# Patient Record
Sex: Male | Born: 1955 | Race: White | Hispanic: No | Marital: Married | Smoking: Current every day smoker
Health system: Southern US, Community
[De-identification: ages and names within clinical notes are randomized; demographics above are authoritative.]

## PROBLEM LIST (undated history)

## (undated) ENCOUNTER — Emergency Department: Disposition: A | Discharge status: Home / Self Care | Payer: 59

## (undated) HISTORY — PX: KNEE SURGERY: SHX244

---

## 2008-07-27 ENCOUNTER — Ambulatory Visit: Payer: Self-pay | Admitting: Unknown Physician Specialty

## 2010-12-11 ENCOUNTER — Ambulatory Visit: Payer: Self-pay

## 2010-12-25 ENCOUNTER — Ambulatory Visit: Payer: Self-pay | Admitting: Unknown Physician Specialty

## 2010-12-25 DIAGNOSIS — Z01810 Encounter for preprocedural cardiovascular examination: Secondary | ICD-10-CM

## 2010-12-27 ENCOUNTER — Ambulatory Visit: Payer: Self-pay | Admitting: Unknown Physician Specialty

## 2012-05-21 ENCOUNTER — Ambulatory Visit: Payer: Self-pay | Admitting: Gastroenterology

## 2012-05-24 LAB — PATHOLOGY REPORT

## 2014-03-13 ENCOUNTER — Emergency Department: Payer: Self-pay | Admitting: Emergency Medicine

## 2014-03-13 LAB — CBC
HCT: 41.9 % (ref 40.0–52.0)
HGB: 14.5 g/dL (ref 13.0–18.0)
MCH: 35.4 pg — AB (ref 26.0–34.0)
MCHC: 34.5 g/dL (ref 32.0–36.0)
MCV: 102 fL — AB (ref 80–100)
PLATELETS: 164 10*3/uL (ref 150–440)
RBC: 4.09 10*6/uL — ABNORMAL LOW (ref 4.40–5.90)
RDW: 14 % (ref 11.5–14.5)
WBC: 9.2 10*3/uL (ref 3.8–10.6)

## 2014-03-13 LAB — COMPREHENSIVE METABOLIC PANEL
ALT: 59 U/L (ref 12–78)
ANION GAP: 6 — AB (ref 7–16)
AST: 49 U/L — AB (ref 15–37)
Albumin: 3.7 g/dL (ref 3.4–5.0)
Alkaline Phosphatase: 75 U/L
BILIRUBIN TOTAL: 0.5 mg/dL (ref 0.2–1.0)
BUN: 15 mg/dL (ref 7–18)
CO2: 28 mmol/L (ref 21–32)
CREATININE: 0.9 mg/dL (ref 0.60–1.30)
Calcium, Total: 8.5 mg/dL (ref 8.5–10.1)
Chloride: 102 mmol/L (ref 98–107)
Glucose: 106 mg/dL — ABNORMAL HIGH (ref 65–99)
Osmolality: 273 (ref 275–301)
Potassium: 3.9 mmol/L (ref 3.5–5.1)
SODIUM: 136 mmol/L (ref 136–145)
TOTAL PROTEIN: 7.5 g/dL (ref 6.4–8.2)

## 2014-03-13 LAB — CK TOTAL AND CKMB (NOT AT ARMC)
CK, Total: 90 U/L
CK-MB: 1.3 ng/mL (ref 0.5–3.6)

## 2014-03-13 LAB — PRO B NATRIURETIC PEPTIDE: B-TYPE NATIURETIC PEPTID: 159 pg/mL — AB (ref 0–125)

## 2014-03-13 LAB — TROPONIN I: Troponin-I: <0.02 ng/mL

## 2014-12-29 ENCOUNTER — Other Ambulatory Visit: Payer: Self-pay | Admitting: Unknown Physician Specialty

## 2014-12-29 DIAGNOSIS — R112 Nausea with vomiting, unspecified: Secondary | ICD-10-CM

## 2014-12-29 DIAGNOSIS — R1013 Epigastric pain: Secondary | ICD-10-CM

## 2015-01-29 ENCOUNTER — Ambulatory Visit: Payer: 59

## 2015-01-29 ENCOUNTER — Encounter: Admission: RE | Admit: 2015-01-29 | Payer: 59 | Source: Ambulatory Visit

## 2015-10-05 ENCOUNTER — Encounter: Payer: Self-pay | Admitting: *Deleted

## 2015-10-08 ENCOUNTER — Encounter: Admission: RE | Payer: Self-pay | Source: Ambulatory Visit

## 2015-10-08 ENCOUNTER — Encounter: Payer: Self-pay | Admitting: Anesthesiology

## 2015-10-08 ENCOUNTER — Ambulatory Visit: Admission: RE | Admit: 2015-10-08 | Payer: 59 | Source: Ambulatory Visit | Admitting: Unknown Physician Specialty

## 2015-10-08 SURGERY — ESOPHAGOGASTRODUODENOSCOPY (EGD) WITH PROPOFOL
Anesthesia: General

## 2015-10-09 ENCOUNTER — Other Ambulatory Visit
Admission: RE | Admit: 2015-10-09 | Discharge: 2015-10-09 | Disposition: A | Discharge status: Home / Self Care | Payer: Worker's Compensation | Attending: Family Medicine | Admitting: Family Medicine

## 2015-10-10 NOTE — ED Notes (Addendum)
Performed blood alcohol and breathalyzer ED Tech Renesha Lizama assisted by [employer]. Blood alcohol taking to lab for [facility] pickup. Breathalyzer first reading .110 waited 15 minutes 2nd breathalyzer reading .099.

## 2016-11-08 ENCOUNTER — Emergency Department: Payer: Commercial Managed Care - PPO

## 2016-11-08 ENCOUNTER — Encounter: Payer: Self-pay | Admitting: Emergency Medicine

## 2016-11-08 ENCOUNTER — Emergency Department
Admission: EM | Admit: 2016-11-08 | Discharge: 2016-11-08 | Disposition: A | Discharge status: Home / Self Care | Payer: Commercial Managed Care - PPO | Attending: Emergency Medicine | Admitting: Emergency Medicine

## 2016-11-08 DIAGNOSIS — S43014A Anterior dislocation of right humerus, initial encounter: Secondary | ICD-10-CM | POA: Diagnosis not present

## 2016-11-08 DIAGNOSIS — Y999 Unspecified external cause status: Secondary | ICD-10-CM | POA: Diagnosis not present

## 2016-11-08 DIAGNOSIS — Y939 Activity, unspecified: Secondary | ICD-10-CM | POA: Insufficient documentation

## 2016-11-08 DIAGNOSIS — F17210 Nicotine dependence, cigarettes, uncomplicated: Secondary | ICD-10-CM | POA: Diagnosis not present

## 2016-11-08 DIAGNOSIS — W010XXA Fall on same level from slipping, tripping and stumbling without subsequent striking against object, initial encounter: Secondary | ICD-10-CM | POA: Insufficient documentation

## 2016-11-08 DIAGNOSIS — S43004A Unspecified dislocation of right shoulder joint, initial encounter: Secondary | ICD-10-CM

## 2016-11-08 DIAGNOSIS — Z79899 Other long term (current) drug therapy: Secondary | ICD-10-CM | POA: Diagnosis not present

## 2016-11-08 DIAGNOSIS — Y929 Unspecified place or not applicable: Secondary | ICD-10-CM | POA: Insufficient documentation

## 2016-11-08 DIAGNOSIS — S4991XA Unspecified injury of right shoulder and upper arm, initial encounter: Secondary | ICD-10-CM | POA: Diagnosis present

## 2016-11-08 MED ORDER — PROPOFOL 10 MG/ML IV BOLUS
INTRAVENOUS | Status: AC | PRN
  Administered 2016-11-08: 50 mg via INTRAVENOUS
  Administered 2016-11-08: 100 mg via INTRAVENOUS
  Administered 2016-11-08: 50 mg via INTRAVENOUS

## 2016-11-08 MED ORDER — PROPOFOL 10 MG/ML IV BOLUS
100.0000 mg | Freq: Once | INTRAVENOUS | Status: AC
  Administered 2016-11-08: 100 mg via INTRAVENOUS

## 2016-11-08 MED ORDER — LIDOCAINE HCL (PF) 1 % IJ SOLN
30.0000 mL | Freq: Once | INTRAMUSCULAR | Status: AC
  Administered 2016-11-08: 30 mL
  Filled 2016-11-08: qty 30

## 2016-11-08 MED ORDER — SODIUM CHLORIDE 0.9 % IV BOLUS (SEPSIS)
1000.0000 mL | Freq: Once | INTRAVENOUS | Status: AC
  Administered 2016-11-08: 1000 mL via INTRAVENOUS

## 2016-11-08 MED ORDER — PROPOFOL 1000 MG/100ML IV EMUL
INTRAVENOUS | Status: AC
  Administered 2016-11-08: 100 mg via INTRAVENOUS
  Filled 2016-11-08: qty 100

## 2016-11-08 NOTE — Discharge Instructions (Addendum)
Please keep your shoulder and the immobilizer at all times until you're cleared by an orthopedic surgeon. Return to the emergency department for any new or worsening symptoms such as worsening pain, numbness, weakness, or for any other concerns.

## 2016-11-08 NOTE — Sedation Documentation (Signed)
Patient is resting comfortably. 

## 2016-11-08 NOTE — ED Provider Notes (Signed)
[facility] Emergency Department Provider Note  ____________________________________________   None    (approximate)  I have reviewed the triage vital signs and the nursing notes.   HISTORY  Chief Complaint Shoulder Injury    HPI Micheal Decker is a 61 y.o. male who comes to the emergency department with reported right shoulder dislocation sustained 24 hours ago. He is right-hand dominant and is retired. He was at home when he slipped and fell sustaining the injury. He initially thought that his shoulder was sprained and went to sleep but was unable to sleep last night much secondary to discomfort. Today he walked into the [facility] where he had an x-ray done which reportedly shows a dislocation was advised to come to the emergency department. He has moderate to severe aching stabbing right shoulder pain worse with movement and improved with rest. It is nonradiating.   History reviewed. No pertinent past medical history.  There are no active problems to display for this patient.   Past Surgical History:  Procedure Laterality Date  . KNEE SURGERY      Prior to Admission medications   Medication Sig Start Date End Date Taking? Authorizing Provider  azelastine (ASTELIN) 0.1 % nasal spray Place into both nostrils 2 (two) times daily. Use in each nostril as directed    Historical Provider, MD  fluticasone (FLONASE) 50 MCG/ACT nasal spray Place 1 spray into both nostrils daily.    Historical Provider, MD  LORazepam (ATIVAN) 1 MG tablet Take 1 mg by mouth every 8 (eight) hours.    Historical Provider, MD  pantoprazole (PROTONIX) 40 MG tablet Take 40 mg by mouth daily.    Historical Provider, MD  pseudoephedrine (SUDAFED) 30 MG tablet Take 30 mg by mouth every 4 (four) hours as needed for congestion.    Historical Provider, MD  tadalafil (CIALIS) 10 MG tablet Take 10 mg by mouth daily as needed for erectile dysfunction.    Historical Provider, MD     Allergies Patient has no known allergies.  History reviewed. No pertinent family history.  Social History Social History  Substance Use Topics  . Smoking status: Current Every Day Smoker    Packs/day: 1.50    Types: Cigarettes  . Smokeless tobacco: Never Used  . Alcohol use Yes    Review of Systems Constitutional: No fever/chills Eyes: No visual changes. ENT: No sore throat. Cardiovascular: Denies chest pain. Respiratory: Denies shortness of breath. Gastrointestinal: No abdominal pain.  No nausea, no vomiting.  No diarrhea.  No constipation. Genitourinary: Negative for dysuria. Musculoskeletal: Negative for back pain.Positive for shoulder pain Skin: Negative for rash. Neurological: Negative for headaches, focal weakness or numbness.  10-point ROS otherwise negative.  ____________________________________________   PHYSICAL EXAM:  VITAL SIGNS: ED Triage Vitals [11/08/16 1345]  Enc Vitals Group     BP (!) 161/72     Pulse Rate 100     Resp 18     Temp 98.4 F (36.9 C)     Temp Source Oral     SpO2 96 %     Weight 215 lb (97.5 kg)     Height '5\' 10"'$  (1.778 m)     Head Circumference      Peak Flow      Pain Score 5     Pain Loc      Pain Edu?      Excl. in [place]?     Constitutional: Alert and oriented. Appears uncomfortable holding his right shoulder  internally rotated and flexed Eyes: Conjunctivae are normal. PERRL. EOMI. Head: Atraumatic. Nose: No congestion/rhinnorhea. Mouth/Throat: Mucous membranes are moist.  Oropharynx non-erythematous. Neck: No stridor.   Cardiovascular: Normal rate, regular rhythm. Grossly normal heart sounds.  Good peripheral circulation. Respiratory: Normal respiratory effort.  No retractions. Lungs CTAB. Gastrointestinal: Soft and nontender. No distention. No abdominal bruits. No CVA tenderness. Musculoskeletal: Right shoulder internally rotated and flexed at the elbow unable to externally rotate and unable to touch left shoulder  with right hand palpable defect on the right shoulder his sensation intact to light touch of her deltoid first dorsal webspace distal index finger distal small finger he can fire his deltoid cross 2 on 3 extend his wrist and flexion oppose his thumb Neurologic:  Normal speech and language. No gross focal neurologic deficits are appreciated. No gait instability. Skin:  Skin is warm, dry and intact. No rash noted. Psychiatric: Mood and affect are normal. Speech and behavior are normal.  ____________________________________________   LABS (all labs ordered are listed, but only abnormal results are displayed)  Labs Reviewed - No data to display ____________________________________________  EKG   ____________________________________________  RADIOLOGY  First x-ray showed anterior shoulder dislocation second x-ray showed confirmed reduction ____________________________________________   PROCEDURES  Procedure(s) performed: yes  Reduction of dislocation Date/Time: 2:33 PM Performed by: Darel Hong Authorized by: Darel Hong Consent: Verbal consent obtained. Risks and benefits: risks, benefits and alternatives were discussed Consent given by: patient Required items: required blood products, implants, devices, and special equipment available Time out: Immediately prior to procedure a "time out" was called to verify the correct patient, procedure, equipment, support staff and site/side marked as required.  Patient sedated: with a total of '200mg'$  of IV propofol  Vitals: Vital signs were monitored during sedation. Patient tolerance: Patient tolerated the procedure well with no immediate complications. Joint: right shoulder Reduction technique: external rotation and supination   Procedural sedation Performed by: Darel Hong Consent: Verbal consent obtained. Risks and benefits: risks, benefits and alternatives were discussed Required items: required blood products,  implants, devices, and special equipment available Patient identity confirmed: arm band and provided demographic data Time out: Immediately prior to procedure a "time out" was called to verify the correct patient, procedure, equipment, support staff and site/side marked as required.  Sedation type: moderate (conscious) sedation NPO time confirmed and considedered  Sedatives: PROPOFOL  Physician Time at Bedside: 20 min  Vitals: Vital signs were monitored during sedation. Cardiac Monitor, pulse oximeter Patient tolerance: Patient tolerated the procedure well with no immediate complications. Comments: Pt with uneventful recovered. Returned to pre-procedural sedation baseline     Procedures  Critical Care performed: no  ____________________________________________   INITIAL IMPRESSION / ASSESSMENT AND PLAN / ED COURSE  Pertinent labs & imaging results that were available during my care of the patient were reviewed by me and considered in my medical decision making (see chart for details).  On arrival the patient came with with a reported shoulder dislocation. His clinical exam was consistent with anterior dislocation but unfortunately I was unable to see his previous films. He arrived neurovascularly intact in roughly 24 hours after the incident. New x-ray confirmed anterior shoulder dislocation. I initially sterilely injected a total of 17 cc of 1% lidocaine without epinephrine into his joint using direct ultrasound visualization with an 18-gauge spinal needle after getting a flash of hematoma but was unable to reduce his shoulder thereafter. He had a significant amount of muscle spasm. I then consented the patient for procedural  sedation and gave a total of 200 mg of propofol and was able to successfully reduce his shoulder dislocation. He is placed in a shoulder immobilizer and was neurovascularly intact thereafter. He awoke from the propofol well and his wife came to the emergency  department to pick him up. He is discharged home with orthopedic follow-up and is discharged in stable condition with an x-ray confirming successful reduction of the shoulder.      ____________________________________________   FINAL CLINICAL IMPRESSION(S) / ED DIAGNOSES  Final diagnoses:  Dislocation of right shoulder joint, initial encounter      NEW MEDICATIONS STARTED DURING THIS VISIT:  Discharge Medication List as of 11/08/2016  4:57 PM       Note:  This document was prepared using Dragon voice recognition software and may include unintentional dictation errors.     Darel Hong, MD 11/09/16 [phone]

## 2016-11-08 NOTE — ED Triage Notes (Signed)
Pt tripped and fell into wall yesterday. Went to [facility] and sent here for right shoulder dislocation per xray

## 2016-11-08 NOTE — ED Notes (Signed)
MD at bedside. 

## 2016-11-08 NOTE — Sedation Documentation (Signed)
Patient denies pain and is resting comfortably.  

## 2018-03-04 ENCOUNTER — Other Ambulatory Visit: Payer: Self-pay | Admitting: Specialist

## 2018-03-04 DIAGNOSIS — R0602 Shortness of breath: Secondary | ICD-10-CM

## 2018-03-04 DIAGNOSIS — R918 Other nonspecific abnormal finding of lung field: Secondary | ICD-10-CM

## 2018-03-08 ENCOUNTER — Ambulatory Visit
Admission: RE | Admit: 2018-03-08 | Discharge: 2018-03-08 | Disposition: A | Discharge status: Home / Self Care | Payer: Commercial Managed Care - PPO | Source: Ambulatory Visit | Attending: Specialist | Admitting: Specialist

## 2018-03-08 DIAGNOSIS — R918 Other nonspecific abnormal finding of lung field: Secondary | ICD-10-CM | POA: Insufficient documentation

## 2018-03-08 DIAGNOSIS — E278 Other specified disorders of adrenal gland: Secondary | ICD-10-CM | POA: Insufficient documentation

## 2018-03-08 DIAGNOSIS — J432 Centrilobular emphysema: Secondary | ICD-10-CM | POA: Diagnosis not present

## 2018-03-08 DIAGNOSIS — I2510 Atherosclerotic heart disease of native coronary artery without angina pectoris: Secondary | ICD-10-CM | POA: Insufficient documentation

## 2018-03-08 DIAGNOSIS — R0602 Shortness of breath: Secondary | ICD-10-CM | POA: Diagnosis present

## 2018-03-08 DIAGNOSIS — C3490 Malignant neoplasm of unspecified part of unspecified bronchus or lung: Secondary | ICD-10-CM | POA: Insufficient documentation

## 2018-03-08 DIAGNOSIS — I700 Atherosclerosis of aorta: Secondary | ICD-10-CM | POA: Diagnosis not present

## 2018-03-11 ENCOUNTER — Other Ambulatory Visit: Payer: Self-pay | Admitting: Specialist

## 2018-03-11 DIAGNOSIS — R918 Other nonspecific abnormal finding of lung field: Secondary | ICD-10-CM

## 2018-03-14 DIAGNOSIS — R918 Other nonspecific abnormal finding of lung field: Secondary | ICD-10-CM | POA: Insufficient documentation

## 2018-03-14 NOTE — Progress Notes (Signed)
[place]  [phone] [phone]  ID: Micheal Decker OB: 1955-12-26  MR#: [id number]  QQI#:[id number]  Patient Care Team: [place] Pink, MD as PCP - General (Family Medicine) Telford Nab, RN as Registered Nurse  CHIEF COMPLAINT: Right lower lobe lung mass.  INTERVAL HISTORY: Patient is a 62 year old male with a chronic cough who had acute onset chest pain approximately 2 weeks ago.  His pain has since resolved, chest x-ray and subsequent CT scan revealed a right lower lobe lung mass highly suspicious for malignancy.  He has no neurologic complaints.  He has a fair appetite and admits some weight loss.  He denies any recent fevers.  He has no chest pain, shortness of breath, or hemoptysis.  He denies any nausea, vomiting, constipation, or diarrhea.  He has no urinary complaints.  Patient otherwise feels well and offers no further specific complaints.  REVIEW OF SYSTEMS:   Review of Systems  Constitutional: Positive for weight loss. Negative for fever and malaise/fatigue.  Respiratory: Positive for cough. Negative for hemoptysis and shortness of breath.   Cardiovascular: Negative.  Negative for chest pain and leg swelling.  Gastrointestinal: Negative.  Negative for abdominal pain.  Genitourinary: Negative.  Negative for dysuria.  Musculoskeletal: Negative.  Negative for back pain.  Skin: Negative.  Negative for rash.  Neurological: Negative.  Negative for sensory change, focal weakness and weakness.  Psychiatric/Behavioral: Negative.  The patient is not nervous/anxious.     As per HPI. Otherwise, a complete review of systems is negative.  PAST MEDICAL HISTORY: History reviewed. No pertinent past medical history.  PAST SURGICAL HISTORY: Past Surgical History:  Procedure Laterality Date  . KNEE SURGERY      FAMILY HISTORY: Family History  Problem Relation Age of Onset  . Stroke Mother   . Heart attack Father   . Diabetes Sister     . Heart attack Sister   . Cancer Sister     ADVANCED DIRECTIVES (Y/N):  N  HEALTH MAINTENANCE: Social History   Tobacco Use  . Smoking status: Current Every Day Smoker    Packs/day: 1.50    Types: Cigarettes  . Smokeless tobacco: Never Used  Substance Use Topics  . Alcohol use: Not Currently    Comment: quit drinking 2018  . Drug use: Never     Colonoscopy:  PAP:  Bone density:  Lipid panel:  No Known Allergies  Current Outpatient Medications  Medication Sig Dispense Refill  . benzonatate (TESSALON) 100 MG capsule Take 1 capsule by mouth daily.  0  . cetirizine (ZYRTEC) 10 MG tablet Take 1 tablet by mouth daily.    . meloxicam (MOBIC) 15 MG tablet Take 15 mg by mouth daily.  1  . montelukast (SINGULAIR) 10 MG tablet Take 1 tablet by mouth every evening.    Marland Kitchen omeprazole (PRILOSEC) 20 MG capsule Take 1 capsule by mouth daily.    . predniSONE (DELTASONE) 5 MG tablet Take 1 tablet by mouth daily.  0  . tadalafil (CIALIS) 10 MG tablet Take 10 mg by mouth daily as needed for erectile dysfunction.    . fluticasone (FLONASE) 50 MCG/ACT nasal spray Place 1 spray into both nostrils daily.    . pseudoephedrine (SUDAFED) 30 MG tablet Take 30 mg by mouth every 4 (four) hours as needed for congestion.     No current facility-administered medications for this visit.     OBJECTIVE: Vitals:   03/15/18 1022 03/15/18 1036  BP:  106/65  Pulse:  67  Resp: 16   Temp:  98 F (36.7 C)     Body mass index is 27.5 kg/m.    ECOG FS:0 - Asymptomatic  General: Well-developed, well-nourished, no acute distress. Eyes: Pink conjunctiva, anicteric sclera. HEENT: Normocephalic, moist mucous membranes, clear oropharnyx. Lungs: Clear to auscultation bilaterally. Heart: Regular rate and rhythm. No rubs, murmurs, or gallops. Abdomen: Soft, nontender, nondistended. No organomegaly noted, normoactive bowel sounds. Musculoskeletal: No edema, cyanosis, or clubbing. Neuro: Alert, answering all  questions appropriately. Cranial nerves grossly intact. Skin: No rashes or petechiae noted. Psych: Normal affect. Lymphatics: No cervical, calvicular, axillary or inguinal LAD.   LAB RESULTS:  Lab Results  Component Value Date   NA 136 03/13/2014   K 3.9 03/13/2014   CL 102 03/13/2014   CO2 28 03/13/2014   GLUCOSE 106 (H) 03/13/2014   BUN 15 03/13/2014   CREATININE 0.90 03/13/2014   CALCIUM 8.5 03/13/2014   PROT 7.5 03/13/2014   ALBUMIN 3.7 03/13/2014   AST 49 (H) 03/13/2014   ALT 59 03/13/2014   ALKPHOS 75 03/13/2014   BILITOT 0.5 03/13/2014   GFRNONAA >60 03/13/2014   GFRAA >60 03/13/2014    Lab Results  Component Value Date   WBC 9.2 03/13/2014   HGB 14.5 03/13/2014   HCT 41.9 03/13/2014   MCV 102 (H) 03/13/2014   PLT 164 03/13/2014     STUDIES: Ct Chest Wo Contrast  Result Date: 03/08/2018 CLINICAL DATA:  Outside chest radiograph demonstrating right lower lobe lung mass. Productive cough for 3 weeks with right lower chest pain. 35 x 1.5 pack-year smoking history. EXAM: CT CHEST WITHOUT CONTRAST TECHNIQUE: Multidetector CT imaging of the chest was performed following the standard protocol without IV contrast. COMPARISON:  Report of chest radiograph 03/02/2018.  No prior CT. FINDINGS: Cardiovascular: Aortic and branch vessel atherosclerosis. Normal heart size, without pericardial effusion. Multivessel coronary artery atherosclerosis. Aortic valve calcification. Mediastinum/Nodes: Multiple small left low jugular/supraclavicular nodes. The largest measures 10 mm on image 6/2. A right lower mediastinal node measures 1.5 cm on image 83/2. No left hilar adenopathy. Right retrocrural node of 10 mm on image 133/2. Lungs/Pleura: No pleural fluid.  Mild centrilobular emphysema. Pleural-based right lower lobe lung mass with extensive contact of the right major fissure. This measures 7.7 x 7.5 cm on image 92/3. No osseous destruction. There is suggestion of chest wall involvement, as  evidenced by ill definition of fat planes between the seventh and eighth right ribs. Example image 95/2. There is direct extension of tumor versus contiguous adenopathy in the right infrahilar region. This area is difficult to differentiate from right pulmonary artery branches, including on image 87/2. Upper Abdomen: Normal imaged portions of the liver, spleen, pancreas, kidneys. Proximal gastric underdistention. Right larger than left adrenal lesions. Example at 2.5 x 2.3 cm on the left. 4.5 x 3.2 cm on the right. Prominent retroperitoneal nodes, including at 12 mm left peritoneal on image 179/2. Musculoskeletal: No acute osseous abnormality. IMPRESSION: 1. Right lower lobe primary bronchogenic carcinoma. Suspicion of small volume chest wall involvement, especially given the clinical history of chest pain. 2. Direct tumor extension and/or adenopathy in the right infrahilar region. Suspicious nodes within the mediastinum, low neck, and upper abdomen. 3. Bilateral adrenal masses, suspicious for metastatic disease. 4. Aortic atherosclerosis (ICD10-I70.0), coronary artery atherosclerosis and emphysema (ICD10-J43.9). Electronically Signed   By: Abigail Miyamoto M.D.   On: 03/08/2018 14:31    ASSESSMENT: Right lower lobe lung mass  PLAN:    1.  Right lower lobe lung mass: CT scan results from 11/08/2017 reviewed independently and report as above highly suspicious for underlying malignancy, likely lung primary.  Patient also noted to have bilateral adrenal masses that are suspicious for metastasis making this stage IV disease.  Patient has a PET scan scheduled for March 17, 2018 which she has been instructed to keep this appointment.  Will order CT-guided biopsy of his right lower lobe mass to obtain a diagnosis.  Patient reports he cannot take MRI secondary to anxiety, therefore will order CT scan of the head once a diagnosis is obtained.  Return to clinic in approximately 2 weeks after his biopsy to discuss the results  and treatment planning.  I spent a total of 60 minutes face-to-face with the patient of which greater than 50% of the visit was spent in counseling and coordination of care as detailed above.  Patient expressed understanding and was in agreement with this plan. He also understands that He can call clinic at any time with any questions, concerns, or complaints.   Cancer Staging No matching staging information was found for the patient.  Lloyd Huger, MD   03/15/2018 1:09 PM

## 2018-03-15 ENCOUNTER — Other Ambulatory Visit: Payer: Self-pay

## 2018-03-15 ENCOUNTER — Encounter (INDEPENDENT_AMBULATORY_CARE_PROVIDER_SITE_OTHER): Payer: Self-pay

## 2018-03-15 ENCOUNTER — Encounter: Payer: Self-pay | Admitting: Oncology

## 2018-03-15 ENCOUNTER — Encounter: Payer: Self-pay | Admitting: *Deleted

## 2018-03-15 ENCOUNTER — Inpatient Hospital Stay: Payer: Commercial Managed Care - PPO | Attending: Oncology | Admitting: Oncology

## 2018-03-15 DIAGNOSIS — R918 Other nonspecific abnormal finding of lung field: Secondary | ICD-10-CM

## 2018-03-15 DIAGNOSIS — R110 Nausea: Secondary | ICD-10-CM | POA: Insufficient documentation

## 2018-03-15 DIAGNOSIS — R590 Localized enlarged lymph nodes: Secondary | ICD-10-CM | POA: Diagnosis not present

## 2018-03-15 DIAGNOSIS — Z79899 Other long term (current) drug therapy: Secondary | ICD-10-CM | POA: Insufficient documentation

## 2018-03-15 DIAGNOSIS — C3431 Malignant neoplasm of lower lobe, right bronchus or lung: Secondary | ICD-10-CM | POA: Diagnosis present

## 2018-03-15 DIAGNOSIS — C7972 Secondary malignant neoplasm of left adrenal gland: Secondary | ICD-10-CM | POA: Diagnosis not present

## 2018-03-15 DIAGNOSIS — C7931 Secondary malignant neoplasm of brain: Secondary | ICD-10-CM | POA: Insufficient documentation

## 2018-03-15 DIAGNOSIS — Z5111 Encounter for antineoplastic chemotherapy: Secondary | ICD-10-CM | POA: Insufficient documentation

## 2018-03-15 DIAGNOSIS — F17210 Nicotine dependence, cigarettes, uncomplicated: Secondary | ICD-10-CM

## 2018-03-15 DIAGNOSIS — C7971 Secondary malignant neoplasm of right adrenal gland: Secondary | ICD-10-CM | POA: Insufficient documentation

## 2018-03-15 NOTE — Progress Notes (Signed)
  Oncology Nurse Navigator Documentation  Navigator Location: [facility]-Med Onc (03/15/18 1300) Referral date to RadOnc/MedOnc: 03/11/18 (03/15/18 1300) )Navigator Encounter Type: Initial MedOnc (03/15/18 1300)   Abnormal Finding Date: 03/08/18 (03/15/18 1300)                   Treatment Phase: Abnormal Scans (03/15/18 1300) Barriers/Navigation Needs: Coordination of Care (03/15/18 1300)   Interventions: Coordination of Care (03/15/18 1300)   Coordination of Care: Appts;Radiology (03/15/18 1300)        Acuity: Level 2 (03/15/18 1300)   Acuity Level 2: Assistance expediting appointments (03/15/18 1300)  met with patient during initial med-onc consultation with Dr. Grayland Ormond to review results from CT scan and discuss biopsy planning. All questions answered at the time of visit. Pt informed that will be notified by phone with appt for biopsy and follow up with Dr. Grayland Ormond to review results and discuss treatment planning. Contact info given and instructed to call with any further questions. Pt verbalized understanding.    Time Spent with Patient: 60 (03/15/18 1300)

## 2018-03-15 NOTE — Patient Instructions (Signed)
We will call you with your appointments.

## 2018-03-17 ENCOUNTER — Telehealth: Payer: Self-pay | Admitting: *Deleted

## 2018-03-17 ENCOUNTER — Ambulatory Visit
Admission: RE | Admit: 2018-03-17 | Discharge: 2018-03-17 | Disposition: A | Discharge status: Home / Self Care | Payer: Commercial Managed Care - PPO | Source: Ambulatory Visit | Attending: Specialist | Admitting: Specialist

## 2018-03-17 DIAGNOSIS — C7972 Secondary malignant neoplasm of left adrenal gland: Secondary | ICD-10-CM | POA: Insufficient documentation

## 2018-03-17 DIAGNOSIS — R918 Other nonspecific abnormal finding of lung field: Secondary | ICD-10-CM | POA: Diagnosis not present

## 2018-03-17 DIAGNOSIS — C7971 Secondary malignant neoplasm of right adrenal gland: Secondary | ICD-10-CM | POA: Diagnosis not present

## 2018-03-17 DIAGNOSIS — R590 Localized enlarged lymph nodes: Secondary | ICD-10-CM | POA: Insufficient documentation

## 2018-03-17 LAB — GLUCOSE, CAPILLARY: Glucose-Capillary: 107 mg/dL — ABNORMAL HIGH (ref 70–99)

## 2018-03-17 MED ORDER — FLUDEOXYGLUCOSE F - 18 (FDG) INJECTION
10.5600 | Freq: Once | INTRAVENOUS | Status: AC | PRN
  Administered 2018-03-17: 10.56 via INTRAVENOUS

## 2018-03-17 NOTE — Telephone Encounter (Signed)
Pt called to report that had coughing episode and coughed up blood tinged sputum X1. Called pt back and left message that this is not uncommon and to continue to monitor. Instructed to call back if hemoptysis worsens.

## 2018-03-17 NOTE — Telephone Encounter (Signed)
Agreed.  Thank you

## 2018-03-19 ENCOUNTER — Telehealth: Payer: Self-pay | Admitting: *Deleted

## 2018-03-19 NOTE — Telephone Encounter (Signed)
Left vm for patient regarding upcoming appointments. Pt is scheduled for CT guided biopsy on Thursday 7/11 at 9:30, arrive at 8:30. Left message for patient to call back regarding appointment details.

## 2018-03-19 NOTE — Telephone Encounter (Signed)
Contacted patient and reviewed PET scan results as well as answering questions regarding potential treatment options and upcoming biopsy and brain imaging. Patient is to call back with any questions or if he has not received a call with biopsy information by Monday.

## 2018-03-22 ENCOUNTER — Telehealth: Payer: Self-pay | Admitting: *Deleted

## 2018-03-22 MED ORDER — OMEPRAZOLE 20 MG PO CPDR: 40.0000 mg | DELAYED_RELEASE_CAPSULE | Freq: Every day | ORAL | 11 refills | Status: AC

## 2018-03-22 NOTE — Telephone Encounter (Signed)
Called pt to give appt info for CT biopsy on 7/11 at 9:30am, [facility] at 8:30am [facility] the medical mall to [facility]. Prep instructions given to patient and informed to expect phone call from RN day before procedure to review instructions. Pt informed that will follow up with Dr. Grayland Ormond on Tues 7/16 at 2pm to review results and informed that if results not available on 7/16 then will be reschedule to a later date when results are available. Pt verbalized understanding.   While on the phone with patient, pt informed me that started to experience nausea with vomiting at night after brushing his teeth and has requested if MD could prescribe something to help with that. Per Dr. Grayland Ormond, most likely related to acid reflux and recommended pt increase omeprazole to 40mg  daily. Informed pt of MD recommendations and instructed to call back if not resolved. Pt verbalized understanding.

## 2018-03-24 ENCOUNTER — Other Ambulatory Visit: Payer: Self-pay | Admitting: Radiology

## 2018-03-25 ENCOUNTER — Ambulatory Visit
Admission: RE | Admit: 2018-03-25 | Discharge: 2018-03-25 | Disposition: A | Discharge status: Home / Self Care | Payer: Commercial Managed Care - PPO | Source: Ambulatory Visit | Attending: Oncology | Admitting: Oncology

## 2018-03-25 ENCOUNTER — Ambulatory Visit
Admission: RE | Admit: 2018-03-25 | Discharge: 2018-03-25 | Disposition: A | Discharge status: Home / Self Care | Payer: Commercial Managed Care - PPO | Source: Ambulatory Visit | Attending: Interventional Radiology | Admitting: Interventional Radiology

## 2018-03-25 DIAGNOSIS — J95811 Postprocedural pneumothorax: Secondary | ICD-10-CM | POA: Insufficient documentation

## 2018-03-25 DIAGNOSIS — F17210 Nicotine dependence, cigarettes, uncomplicated: Secondary | ICD-10-CM | POA: Insufficient documentation

## 2018-03-25 DIAGNOSIS — Z791 Long term (current) use of non-steroidal anti-inflammatories (NSAID): Secondary | ICD-10-CM | POA: Diagnosis not present

## 2018-03-25 DIAGNOSIS — Z7952 Long term (current) use of systemic steroids: Secondary | ICD-10-CM | POA: Insufficient documentation

## 2018-03-25 DIAGNOSIS — R918 Other nonspecific abnormal finding of lung field: Secondary | ICD-10-CM | POA: Diagnosis present

## 2018-03-25 DIAGNOSIS — Z79899 Other long term (current) drug therapy: Secondary | ICD-10-CM | POA: Insufficient documentation

## 2018-03-25 DIAGNOSIS — C3490 Malignant neoplasm of unspecified part of unspecified bronchus or lung: Secondary | ICD-10-CM | POA: Insufficient documentation

## 2018-03-25 LAB — CBC
HEMATOCRIT: 32.2 % — AB (ref 40.0–52.0)
Hemoglobin: 10.8 g/dL — ABNORMAL LOW (ref 13.0–18.0)
MCH: 25.9 pg — AB (ref 26.0–34.0)
MCHC: 33.5 g/dL (ref 32.0–36.0)
MCV: 77.2 fL — AB (ref 80.0–100.0)
PLATELETS: 323 10*3/uL (ref 150–440)
RBC: 4.18 MIL/uL — ABNORMAL LOW (ref 4.40–5.90)
RDW: 16.3 % — ABNORMAL HIGH (ref 11.5–14.5)
WBC: 9 10*3/uL (ref 3.8–10.6)

## 2018-03-25 LAB — PROTIME-INR
INR: 1.18
Prothrombin Time: 14.9 seconds (ref 11.4–15.2)

## 2018-03-25 LAB — APTT: aPTT: 45 seconds — ABNORMAL HIGH (ref 24–36)

## 2018-03-25 MED ORDER — FENTANYL CITRATE (PF) 100 MCG/2ML IJ SOLN
INTRAMUSCULAR | Status: AC | PRN
  Administered 2018-03-25: 50 ug via INTRAVENOUS

## 2018-03-25 MED ORDER — LIDOCAINE HCL (PF) 1 % IJ SOLN
INTRAMUSCULAR | Status: AC | PRN
  Administered 2018-03-25: 8 mL

## 2018-03-25 MED ORDER — MIDAZOLAM HCL 5 MG/5ML IJ SOLN
INTRAMUSCULAR | Status: AC | PRN
  Administered 2018-03-25: 1 mg via INTRAVENOUS

## 2018-03-25 MED ORDER — MIDAZOLAM HCL 5 MG/5ML IJ SOLN
INTRAMUSCULAR | Status: AC
  Filled 2018-03-25: qty 5

## 2018-03-25 MED ORDER — FENTANYL CITRATE (PF) 100 MCG/2ML IJ SOLN
INTRAMUSCULAR | Status: AC
  Filled 2018-03-25: qty 4

## 2018-03-25 MED ORDER — HYDROCODONE-ACETAMINOPHEN 5-325 MG PO TABS
1.0000 | ORAL_TABLET | ORAL | Status: DC | PRN
  Administered 2018-03-25: 2 via ORAL
  Filled 2018-03-25: qty 2

## 2018-03-25 MED ORDER — SODIUM CHLORIDE 0.9 % IV SOLN
INTRAVENOUS | Status: DC
  Administered 2018-03-25: 09:00:00 via INTRAVENOUS

## 2018-03-25 MED ORDER — HYDROCODONE-ACETAMINOPHEN 5-325 MG PO TABS
ORAL_TABLET | ORAL | Status: AC
  Filled 2018-03-25: qty 2

## 2018-03-25 NOTE — Procedures (Signed)
Interventional Radiology Procedure Note  Procedure: CT guided core biopsy of RLL lung mass  Complications: Small pneumothorax  Estimated Blood Loss: < 10 mL  Findings: 8.5 cm RLL lung mass.  18 G core biopsy x 2 via 17 G needle yielding solid tissue. Small posterior PTX after biopsy.  Treated with aspiration via 17 G needle with resolution by CT of PTX.  Will follow by CXR during recovery.  Venetia Night. Kathlene Cote, M.D Pager:  [phone]

## 2018-03-25 NOTE — H&P (Signed)
Chief Complaint: Patient was seen in consultation today for  at the request of Finnegan,Timothy J   Referring Physician(s): Finnegan,Timothy J  Patient Status: [facility] - Out-pt  History of Present Illness: Micheal Decker is a 62 y.o. male smoker presenting with 8 cm RLL lung mass and PET evidence of metastatic disease to right hilar, subcarinal, retroperitoneal, iliac and other LN stations.  Bilateral adrenal mets and possible right femur met. Has had some hemoptysis with brushing teeth at night, but denies CP or SOB.  Has fatigue, weight loss and some night sweats.  No past medical history on file.  Past Surgical History:  Procedure Laterality Date  . KNEE SURGERY      Allergies: Patient has no known allergies.  Medications: Prior to Admission medications   Medication Sig Start Date End Date Taking? Authorizing Provider  benzonatate (TESSALON) 100 MG capsule Take 1 capsule by mouth daily. 03/03/18  Yes [provider]  cetirizine (ZYRTEC) 10 MG tablet Take 1 tablet by mouth daily. 03/01/18   [provider]  fluticasone (FLONASE) 50 MCG/ACT nasal spray Place 1 spray into both nostrils daily as needed.     [provider]  meloxicam (MOBIC) 15 MG tablet Take 15 mg by mouth daily. 02/01/18   [provider]  montelukast (SINGULAIR) 10 MG tablet Take 1 tablet by mouth every evening. 03/02/18 03/02/19  [provider]  omeprazole (PRILOSEC) 20 MG capsule Take 2 capsules (40 mg total) by mouth daily. 03/22/18 03/22/19  Lloyd Huger, MD  predniSONE (DELTASONE) 5 MG tablet Take 1 tablet by mouth daily. 03/01/18   [provider]  pseudoephedrine (SUDAFED) 30 MG tablet Take 30 mg by mouth every 4 (four) hours as needed for congestion.    [provider]  tadalafil (CIALIS) 10 MG tablet Take 10 mg by mouth daily as needed for erectile dysfunction.    [provider]     Family History  Problem Relation Age of  Onset  . Stroke Mother   . Heart attack Father   . Diabetes Sister   . Heart attack Sister   . Cancer Sister     Social History   Socioeconomic History  . Marital status: Married    Spouse name: Not on file  . Number of children: Not on file  . Years of education: Not on file  . Highest education level: Not on file  Occupational History  . Not on file  Social Needs  . Financial resource strain: Not on file  . Food insecurity:    Worry: Not on file    Inability: Not on file  . Transportation needs:    Medical: Not on file    Non-medical: Not on file  Tobacco Use  . Smoking status: Current Every Day Smoker    Packs/day: 1.50    Types: Cigarettes  . Smokeless tobacco: Never Used  Substance and Sexual Activity  . Alcohol use: Not Currently    Comment: quit drinking 2018  . Drug use: Never  . Sexual activity: Not on file  Lifestyle  . Physical activity:    Days per week: Not on file    Minutes per session: Not on file  . Stress: Not on file  Relationships  . Social connections:    Talks on phone: Not on file    Gets together: Not on file    Attends religious service: Not on file    Active member of club or organization: Not on  file    Attends meetings of clubs or organizations: Not on file    Relationship status: Not on file  Other Topics Concern  . Not on file  Social History Narrative  . Not on file    ECOG Status: 1 - Symptomatic but completely ambulatory  Review of Systems: A 12 point ROS discussed and pertinent positives are indicated in the HPI above.  All other systems are negative.  Review of Systems  Constitutional: Positive for fatigue and unexpected weight change.  HENT: Negative.   Respiratory:       Some hemoptysis recently of small amount when brushing teeth, which induces coughing.  Cardiovascular: Negative.   Gastrointestinal: Negative.   Genitourinary: Negative.   Musculoskeletal: Negative.   Neurological: Negative.     Vital  Signs: BP 115/65   Pulse 82   Resp 18   Ht _0  (1.753 m)   Wt 186 lb (84.4 kg)   SpO2 99%   BMI 27.47 kg/m   Physical Exam  Constitutional: He is oriented to person, place, and time. No distress.  HENT:  Head: Normocephalic and atraumatic.  Neck: Neck supple. No JVD present. No tracheal deviation present. No thyromegaly present.  Cardiovascular: Normal rate and regular rhythm. Exam reveals no gallop and no friction rub.  2/6 SEM  Pulmonary/Chest: Effort normal. No stridor. No respiratory distress. He has no wheezes. He has no rales.  Decreased breath sounds in right lower lung posteriorly.  Abdominal: Soft. Bowel sounds are normal. He exhibits no distension and no mass. There is no tenderness. There is no rebound and no guarding.  Musculoskeletal: He exhibits no edema.  Lymphadenopathy:    He has no cervical adenopathy.  Neurological: He is alert and oriented to person, place, and time.  Skin: Skin is warm and dry. He is not diaphoretic.  Vitals reviewed.   Imaging: Ct Chest Wo Contrast  Result Date: 03/08/2018 CLINICAL DATA:  Outside chest radiograph demonstrating right lower lobe lung mass. Productive cough for 3 weeks with right lower chest pain. 35 x 1.5 pack-year smoking history. EXAM: CT CHEST WITHOUT CONTRAST TECHNIQUE: Multidetector CT imaging of the chest was performed following the standard protocol without IV contrast. COMPARISON:  Report of chest radiograph 03/02/2018.  No prior CT. FINDINGS: Cardiovascular: Aortic and branch vessel atherosclerosis. Normal heart size, without pericardial effusion. Multivessel coronary artery atherosclerosis. Aortic valve calcification. Mediastinum/Nodes: Multiple small left low jugular/supraclavicular nodes. The largest measures 10 mm on image 6/2. A right lower mediastinal node measures 1.5 cm on image 83/2. No left hilar adenopathy. Right retrocrural node of 10 mm on image 133/2. Lungs/Pleura: No pleural fluid.  Mild centrilobular  emphysema. Pleural-based right lower lobe lung mass with extensive contact of the right major fissure. This measures 7.7 x 7.5 cm on image 92/3. No osseous destruction. There is suggestion of chest wall involvement, as evidenced by ill definition of fat planes between the seventh and eighth right ribs. Example image 95/2. There is direct extension of tumor versus contiguous adenopathy in the right infrahilar region. This area is difficult to differentiate from right pulmonary artery branches, including on image 87/2. Upper Abdomen: Normal imaged portions of the liver, spleen, pancreas, kidneys. Proximal gastric underdistention. Right larger than left adrenal lesions. Example at 2.5 x 2.3 cm on the left. 4.5 x 3.2 cm on the right. Prominent retroperitoneal nodes, including at 12 mm left peritoneal on image 179/2. Musculoskeletal: No acute osseous abnormality. IMPRESSION: 1. Right lower lobe primary bronchogenic carcinoma. Suspicion of  small volume chest wall involvement, especially given the clinical history of chest pain. 2. Direct tumor extension and/or adenopathy in the right infrahilar region. Suspicious nodes within the mediastinum, low neck, and upper abdomen. 3. Bilateral adrenal masses, suspicious for metastatic disease. 4. Aortic atherosclerosis (ICD10-I70.0), coronary artery atherosclerosis and emphysema (ICD10-J43.9). Electronically Signed   By: Abigail Miyamoto M.D.   On: 03/08/2018 14:31   Nm Pet Image Initial (pi) Skull Base To Thigh  Result Date: 03/17/2018 CLINICAL DATA:  Initial treatment strategy for right lower lobe lung mass and adenopathy. EXAM: NUCLEAR MEDICINE PET SKULL BASE TO THIGH TECHNIQUE: 10.56 mCi F-18 FDG was injected intravenously. Full-ring PET imaging was performed from the skull base to thigh after the radiotracer. CT data was obtained and used for attenuation correction and anatomic localization. Fasting blood glucose: 107 mg/dl COMPARISON:  Chest CT 03/08/2018 FINDINGS: Mediastinal  blood pool activity: SUV max 1.63 NECK: 10 mm supraclavicular lymph node on the left side is hypermetabolic with SUV max of 1.61. No other neck or supraclavicular adenopathy. No axillary adenopathy. Incidental CT findings: none CHEST: The large right lower lobe lung mass is hypermetabolic with SUV max of 09.60. 19 mm right infrahilar lymph node is hypermetabolic with SUV max of 7.6. Right-sided subcarinal lymph node is also hypermetabolic with SUV max of 4.54. 7.5 mm lymph node between the aorta and the esophagus on image number 93 is mildly hypermetabolic with SUV max of 0.98. Retrocrural lymph node measuring 8 mm on image number 119 is hypermetabolic with SUV max of 1.47. No hypermetabolic metastatic pulmonary nodules. No pleural disease. Incidental CT findings: none ABDOMEN/PELVIS: Bilateral adrenal gland masses are hypermetabolic. The 4.7 cm right adrenal gland mass has an SUV max of 10.3 and the 3 cm left adrenal gland mass has an SUV max of 11.51. Bilateral retroperitoneal lymphadenopathy is hypermetabolic and consistent with metastatic disease. Index nodal lesion in the left para-aortic region on image number 174 measures 2.3 x 2.2 cm and SUV max is 8.22 15 mm right common iliac lymph node on the right side on image number 216 has an SUV max of 6.92. Small bilateral external iliac lymph nodes are hypermetabolic and consistent with metastasis. 10.5 mm left inguinal lymph node on image number 267 has an SUV max of 3.59 Incidental CT findings: none SKELETON: Area of hypermetabolism involving the right hip near the base of the femoral neck has an SUV max of 4.58. On the CT scan there is Fe increased density in this area. No other definite metastatic bone lesions are identified. Areas of hypermetabolism around both shoulders likely related to muscle injuries or possible injections. Incidental CT findings: none IMPRESSION: 1. 7.5 cm right lower lobe lung hypermetabolic mass consistent with primary lung neoplasm.  There is adjacent right infrahilar and subcarinal metastatic adenopathy. 2. Metastatic left supraclavicular adenopathy, retrocrural adenopathy, retroperitoneal lymphadenopathy, pelvic lymphadenopathy and bilateral adrenal gland metastatic disease. 3. Focus of hypermetabolism in the right hip worrisome for metastatic disease. Electronically Signed   By: Marijo Sanes M.D.   On: 03/17/2018 14:36    Labs:  CBC: Recent Labs    03/25/18 0857  WBC 9.0  HGB 10.8*  HCT 32.2*  PLT 323    COAGS: Recent Labs    03/25/18 0857  INR 1.18  APTT 45*    Assessment and Plan:  Presenting with evidence of advanced, metastatic lung carcinoma. For CT guided biopsy of RLL lung mass today. Risks and benefits discussed with the patient including, but not limited  to bleeding, hemoptysis, respiratory failure requiring intubation, infection, pneumothorax requiring chest tube placement, stroke from air embolism or even death. All of the patient's questions were answered, patient is agreeable to proceed. Consent signed and in chart.  Thank you for this interesting consult.  I greatly enjoyed meeting CORDIE BUENING and look forward to participating in their care.  A copy of this report was sent to the requesting provider on this date.  Electronically Signed: Azzie Roup, MD 03/25/2018, 9:58 AM      I spent a total of 30 Minutes  in face to face in clinical consultation, greater than 50% of which was counseling/coordinating care for lung biopsy.

## 2018-03-26 ENCOUNTER — Telehealth: Payer: Self-pay | Admitting: *Deleted

## 2018-03-26 DIAGNOSIS — R112 Nausea with vomiting, unspecified: Secondary | ICD-10-CM

## 2018-03-26 MED ORDER — PROCHLORPERAZINE 25 MG RE SUPP: 25.0000 mg | Freq: Two times a day (BID) | RECTAL | 1 refills | Status: AC | PRN

## 2018-03-26 MED ORDER — ONDANSETRON HCL 8 MG PO TABS: 8.0000 mg | ORAL_TABLET | Freq: Three times a day (TID) | ORAL | 1 refills | Status: AC | PRN

## 2018-03-26 NOTE — Telephone Encounter (Signed)
Called and left message that navigator would also send a suppository for nausea. Left message.

## 2018-03-26 NOTE — Telephone Encounter (Signed)
Opened in error

## 2018-03-26 NOTE — Telephone Encounter (Signed)
Compazine suppositories escribed to pharmacy.

## 2018-03-26 NOTE — Telephone Encounter (Signed)
Pt's wife called in to report that pt's nausea/vomiting is not any better and pt cannot keep anything down. Per Dr. Grayland Ormond, will call in prescription for ondansetron 8mg  TID. Rx has been escribed to pharmacy and pt made aware.

## 2018-03-28 NOTE — Progress Notes (Signed)
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  INTERVAL HISTORY: Patient returns to clinic today to discuss his biopsy results and treatment planning.  He continues to have persistent nausea and weight loss, but otherwise feels well.  He has no neurologic complaints.  He denies any recent fevers or illnesses.  He denies any chest pain, shortness of breath, cough, or hemoptysis.  He has no constipation or diarrhea. He has no urinary complaints.  Patient offers no further specific complaints today.  REVIEW OF SYSTEMS:   Review of Systems  Constitutional: Positive for weight loss. Negative for fever and malaise/fatigue.  Respiratory: Negative.  Negative for cough, hemoptysis and shortness of breath.   Cardiovascular: Negative.  Negative for chest pain and leg swelling.  Gastrointestinal: Positive for nausea and vomiting. Negative for abdominal pain.  Genitourinary: Negative.  Negative for dysuria.  Musculoskeletal: Negative.  Negative for back pain.  Skin: Negative.  Negative for rash.  Neurological: Negative.  Negative for dizziness, sensory change, focal weakness, weakness and headaches.  Psychiatric/Behavioral: The patient is nervous/anxious.     As per HPI. Otherwise, a complete review of systems is negative.  PAST MEDICAL HISTORY: History reviewed. No pertinent past medical history.  PAST SURGICAL HISTORY: Past Surgical History:  Procedure Laterality Date  . KNEE SURGERY      FAMILY HISTORY: Family History  Problem Relation Age of Onset  . Stroke Mother   . Heart attack Father   . Diabetes Sister   . Heart attack Sister   . Cancer Sister     ADVANCED DIRECTIVES  (Y/N):  N  HEALTH MAINTENANCE: Social History   Tobacco Use  . Smoking status: Current Every Day Smoker    Packs/day: 1.50    Types: Cigarettes  . Smokeless tobacco: Never Used  Substance Use Topics  . Alcohol use: Not Currently    Comment: quit drinking 2018  . Drug use: Never     Colonoscopy:  PAP:  Bone density:  Lipid panel:  No Known Allergies  Current Outpatient Medications  Medication Sig Dispense Refill  . meloxicam (MOBIC) 15 MG tablet Take 15 mg by mouth daily.  1  . ondansetron (ZOFRAN) 8 MG tablet Take 1 tablet (8 mg total) by mouth 3 (three) times daily as needed for nausea or vomiting. 30 tablet 1  . tadalafil (CIALIS) 10 MG tablet Take 10 mg by mouth daily as needed for erectile dysfunction.    . benzonatate (TESSALON) 100 MG capsule Take 1 capsule (100 mg total) by mouth 3 (three) times daily as needed for cough. 30 capsule 1  . cetirizine (ZYRTEC) 10 MG tablet Take 1 tablet by mouth daily.    Marland Kitchen dexamethasone (DECADRON) 4 MG tablet Take 1 tablet (4 mg total) by mouth 2 (two) times daily with a meal. 60 tablet 0  . lidocaine-prilocaine (EMLA) cream Apply to affected area once 30 g 3  . montelukast (SINGULAIR) 10 MG tablet Take 1 tablet by mouth every evening.    Marland Kitchen omeprazole (PRILOSEC) 20 MG capsule Take 2 capsules (40 mg total) by mouth daily. 60 capsule 11  . prochlorperazine (COMPAZINE) 25 MG suppository Place 1 suppository (25 mg total) rectally every 12 (twelve) hours as needed for  nausea or vomiting. 12 suppository 1   No current facility-administered medications for this visit.     OBJECTIVE: Vitals:   03/30/18 1428  BP: 125/75  Pulse: 73  Resp: 20  Temp: (!) 97.3 F (36.3 C)     Body mass index is 26.43 kg/m.    ECOG FS:0 - Asymptomatic  General: Well-developed, well-nourished, no acute distress. Eyes: Pink conjunctiva, anicteric sclera. HEENT: Normocephalic, moist mucous membranes, clear oropharnyx. Lungs: Clear to auscultation  bilaterally. Heart: Regular rate and rhythm. No rubs, murmurs, or gallops. Abdomen: Soft, nontender, nondistended. No organomegaly noted, normoactive bowel sounds. Musculoskeletal: No edema, cyanosis, or clubbing. Neuro: Alert, answering all questions appropriately. Cranial nerves grossly intact. Skin: No rashes or petechiae noted. Psych: Normal affect. Lymphatics: No cervical, calvicular, axillary or inguinal LAD.   LAB RESULTS:  Lab Results  Component Value Date   NA 137 04/02/2018   K 4.8 04/02/2018   CL 101 04/02/2018   CO2 26 04/02/2018   GLUCOSE 123 (H) 04/02/2018   BUN 20 04/02/2018   CREATININE 0.93 04/02/2018   CALCIUM 9.1 04/02/2018   PROT 8.1 04/02/2018   ALBUMIN 3.1 (L) 04/02/2018   AST 24 04/02/2018   ALT 14 04/02/2018   ALKPHOS 97 04/02/2018   BILITOT 0.7 04/02/2018   GFRNONAA >60 04/02/2018   GFRAA >60 04/02/2018    Lab Results  Component Value Date   WBC 8.5 04/02/2018   NEUTROABS 6.1 04/02/2018   HGB 10.5 (L) 04/02/2018   HCT 32.2 (L) 04/02/2018   MCV 76.6 (L) 04/02/2018   PLT 374 04/02/2018     STUDIES: Ct Head W Wo Contrast  Result Date: 04/02/2018 CLINICAL DATA:  Metastatic lung cancer staging EXAM: CT HEAD WITHOUT AND WITH CONTRAST TECHNIQUE: Contiguous axial images were obtained from the base of the skull through the vertex without and with intravenous contrast CONTRAST:  59mL ISOVUE-300 IOPAMIDOL (ISOVUE-300) INJECTION 61% COMPARISON:  None. FINDINGS: Brain: 2 metastatic deposits in the left cerebellum. Ring-enhancing lesion measuring 15 mm in the mid cerebellum and 6 mm enhancing lesion posterior left cerebellum. Moderate left cerebellar edema with mass-effect on the fourth ventricle. No midline shift or obstructive hydrocephalus. No other enhancing metastatic deposits. Ventricle size normal.  No acute infarct or hemorrhage Vascular: Negative for hyperdense vessel. Normal vascular enhancement Skull: Negative for skull lesion however there is an  enhancing lesion in the left parietal scalp adjacent to the calvarium which may be due to metastatic disease measuring approximately 6 x 12 mm. Sinuses/Orbits: Negative Other: None IMPRESSION: Metastatic disease left cerebellum with surrounding edema and mild mass effect. No other enhancing lesions Enhancing lesion left parietal scalp, suspicious for metastatic disease to the soft tissues without bony involvement. These results will be called to the ordering clinician or representative by the Radiologist Assistant, and communication documented in the PACS or zVision Dashboard. Electronically Signed   By: Franchot Gallo M.D.   On: 04/02/2018 11:31   Ct Chest Wo Contrast  Result Date: 03/08/2018 CLINICAL DATA:  Outside chest radiograph demonstrating right lower lobe lung mass. Productive cough for 3 weeks with right lower chest pain. 35 x 1.5 pack-year smoking history. EXAM: CT CHEST WITHOUT CONTRAST TECHNIQUE: Multidetector CT imaging of the chest was performed following the standard protocol without IV contrast. COMPARISON:  Report of chest radiograph 03/02/2018.  No prior CT. FINDINGS: Cardiovascular: Aortic and branch vessel atherosclerosis. Normal heart size, without pericardial effusion. Multivessel coronary artery atherosclerosis. Aortic valve calcification. Mediastinum/Nodes: Multiple small left low jugular/supraclavicular nodes. The  largest measures 10 mm on image 6/2. A right lower mediastinal node measures 1.5 cm on image 83/2. No left hilar adenopathy. Right retrocrural node of 10 mm on image 133/2. Lungs/Pleura: No pleural fluid.  Mild centrilobular emphysema. Pleural-based right lower lobe lung mass with extensive contact of the right major fissure. This measures 7.7 x 7.5 cm on image 92/3. No osseous destruction. There is suggestion of chest wall involvement, as evidenced by ill definition of fat planes between the seventh and eighth right ribs. Example image 95/2. There is direct extension of tumor  versus contiguous adenopathy in the right infrahilar region. This area is difficult to differentiate from right pulmonary artery branches, including on image 87/2. Upper Abdomen: Normal imaged portions of the liver, spleen, pancreas, kidneys. Proximal gastric underdistention. Right larger than left adrenal lesions. Example at 2.5 x 2.3 cm on the left. 4.5 x 3.2 cm on the right. Prominent retroperitoneal nodes, including at 12 mm left peritoneal on image 179/2. Musculoskeletal: No acute osseous abnormality. IMPRESSION: 1. Right lower lobe primary bronchogenic carcinoma. Suspicion of small volume chest wall involvement, especially given the clinical history of chest pain. 2. Direct tumor extension and/or adenopathy in the right infrahilar region. Suspicious nodes within the mediastinum, low neck, and upper abdomen. 3. Bilateral adrenal masses, suspicious for metastatic disease. 4. Aortic atherosclerosis (ICD10-I70.0), coronary artery atherosclerosis and emphysema (ICD10-J43.9). Electronically Signed   By: Abigail Miyamoto M.D.   On: 03/08/2018 14:31   Mr Jeri Cos OZ Contrast  Result Date: 04/02/2018 CLINICAL DATA:  Follow up LEFT cerebellar mass. Nausea and vomiting today. History of lung cancer. EXAM: MRI HEAD WITHOUT AND WITH CONTRAST TECHNIQUE: Multiplanar, multiecho pulse sequences of the brain and surrounding structures were obtained without and with intravenous contrast. CONTRAST:  85mL MULTIHANCE GADOBENATE DIMEGLUMINE 529 MG/ML IV SOLN COMPARISON:  CT HEAD with and without contrast April 02, 2018. FINDINGS: INTRACRANIAL CONTENTS: Subcentimeter reduced diffusion RIGHT mesial cerebellum with low ADC values. No susceptibility artifact to suggest hemorrhage with particular attention to LEFT cerebellar nodules. 15 x 14 mm thick-walled enhancing LEFT cerebellar nodule (series 10, image 44. Similar MR characteristics 5 x 8 mm LEFT cerebellar nodule (series 10, image 33).Regional mass effect without midline shift. LEFT  parietoccipital focal dural enhancement subjacent to enhancing scalp nodule. No supratentorial abnormal enhancement. Ventricles and sulci are normal for patient's age. No abnormal extra-axial fluid collections. VASCULAR: Normal major intracranial vascular flow voids present at skull base. SKULL AND UPPER CERVICAL SPINE: No abnormal sellar expansion. Multiple low signal osseous lesions most notable in the clivus and mandible condyles. Craniocervical junction maintained. Reduced diffusion and enhancement 18 x 7 mm LEFT scalp nodule seen on today's CT (series 10, image 88). No underlying calvarial enhancement. SINUSES/ORBITS: The mastoid air-cells and included paranasal sinuses are well-aerated.The included ocular globes and orbital contents are non-suspicious. OTHER: RIGHT nasopharyngeal mucosal retention cyst. IMPRESSION: 1. Acute subcentimeter RIGHT cerebellar infarct. 2. Two LEFT cerebellar metastasis measuring to 15 mm. Regional mass effect without midline shift. 3. 18 x 7 mm LEFT parietoccipital scalp nodule, subjacent dural enhancement concerning calvarial metastasis. 4. Mandible and central skull base suspected osseous metastasis. Consider bone scan. 5. These results will be called to the ordering clinician or representative by the Radiologist Assistant, and communication documented in the PACS or zVision Dashboard. Electronically Signed   By: Elon Alas M.D.   On: 04/02/2018 19:46   Nm Pet Image Initial (pi) Skull Base To Thigh  Result Date: 03/17/2018 CLINICAL DATA:  Initial treatment strategy  for right lower lobe lung mass and adenopathy. EXAM: NUCLEAR MEDICINE PET SKULL BASE TO THIGH TECHNIQUE: 10.56 mCi F-18 FDG was injected intravenously. Full-ring PET imaging was performed from the skull base to thigh after the radiotracer. CT data was obtained and used for attenuation correction and anatomic localization. Fasting blood glucose: 107 mg/dl COMPARISON:  Chest CT 03/08/2018 FINDINGS: Mediastinal  blood pool activity: SUV max 1.63 NECK: 10 mm supraclavicular lymph node on the left side is hypermetabolic with SUV max of 2.37. No other neck or supraclavicular adenopathy. No axillary adenopathy. Incidental CT findings: none CHEST: The large right lower lobe lung mass is hypermetabolic with SUV max of 62.83. 19 mm right infrahilar lymph node is hypermetabolic with SUV max of 7.6. Right-sided subcarinal lymph node is also hypermetabolic with SUV max of 1.51. 7.5 mm lymph node between the aorta and the esophagus on image number 93 is mildly hypermetabolic with SUV max of 7.61. Retrocrural lymph node measuring 8 mm on image number 607 is hypermetabolic with SUV max of 3.71. No hypermetabolic metastatic pulmonary nodules. No pleural disease. Incidental CT findings: none ABDOMEN/PELVIS: Bilateral adrenal gland masses are hypermetabolic. The 4.7 cm right adrenal gland mass has an SUV max of 10.3 and the 3 cm left adrenal gland mass has an SUV max of 11.51. Bilateral retroperitoneal lymphadenopathy is hypermetabolic and consistent with metastatic disease. Index nodal lesion in the left para-aortic region on image number 174 measures 2.3 x 2.2 cm and SUV max is 8.22 15 mm right common iliac lymph node on the right side on image number 216 has an SUV max of 6.92. Small bilateral external iliac lymph nodes are hypermetabolic and consistent with metastasis. 10.5 mm left inguinal lymph node on image number 267 has an SUV max of 3.59 Incidental CT findings: none SKELETON: Area of hypermetabolism involving the right hip near the base of the femoral neck has an SUV max of 4.58. On the CT scan there is Fe increased density in this area. No other definite metastatic bone lesions are identified. Areas of hypermetabolism around both shoulders likely related to muscle injuries or possible injections. Incidental CT findings: none IMPRESSION: 1. 7.5 cm right lower lobe lung hypermetabolic mass consistent with primary lung neoplasm.  There is adjacent right infrahilar and subcarinal metastatic adenopathy. 2. Metastatic left supraclavicular adenopathy, retrocrural adenopathy, retroperitoneal lymphadenopathy, pelvic lymphadenopathy and bilateral adrenal gland metastatic disease. 3. Focus of hypermetabolism in the right hip worrisome for metastatic disease. Electronically Signed   By: Marijo Sanes M.D.   On: 03/17/2018 14:36   Ct Biopsy  Result Date: 03/25/2018 CLINICAL DATA:  Large right lower lobe lung mass and evidence by prior imaging of widespread metastatic disease. Biopsy of the right lung mass is performed to establish tissue diagnosis. EXAM: CT GUIDED CORE BIOPSY OF RIGHT LUNG MASS ANESTHESIA/SEDATION: 1.0 mg IV Versed; 50 mcg IV Fentanyl Total Moderate Sedation Time:  19 minutes. The patient's level of consciousness and physiologic status were continuously monitored during the procedure by Radiology nursing. PROCEDURE: The procedure risks, benefits, and alternatives were explained to the patient. Questions regarding the procedure were encouraged and answered. The patient understands and consents to the procedure. A time-out was performed prior to initiating the procedure. The patient was placed in a prone position and CT performed through the lower lung zones. The right posterior chest wall was prepped with chlorhexidine in a sterile fashion, and a sterile drape was applied covering the operative field. A sterile gown and sterile gloves were used  for the procedure. Local anesthesia was provided with 1% Lidocaine. Under CT guidance, a 17 gauge trocar needle was advanced into a right lower lobe lung mass. After confirming needle tip position, 2 separate 18 gauge core biopsy samples were obtained and samples submitted in formalin. Additional CT was performed. Aspiration of air was performed in the pleural space via the outer 17 gauge needle. Additional CT was performed. COMPLICATIONS: Small right posterior pneumothorax following lung  biopsy. SIR level B: Nominal therapy (including overnight admission for observation), no consequence. FINDINGS: The large posterior right lower lobe lung mass measures approximately 8.3 cm in greatest diameter by current CT. Biopsy yielded solid tissue. Immediately following the second core biopsy sample, CT demonstrates development of a small posterior pneumothorax of roughly 10% volume. This was able to be completely aspirated via the outer 17 gauge needle with additional follow-up CT demonstrating no significant residual air in the pleural space. This will be further followed during recovery by chest x-ray. IMPRESSION: CT-guided core biopsy performed of large 8.3 cm right lower lobe lung mass. The procedure was complicated by a small posterior pneumothorax treated by suction aspiration via the outer 17 gauge needle. This resulted in resolution of pneumothorax by CT. A follow-up chest x-ray will be performed during recovery. Electronically Signed   By: Aletta Edouard M.D.   On: 03/25/2018 12:14   Dg Chest Port 1 View  Result Date: 03/25/2018 CLINICAL DATA:  Right pneumothorax after biopsy of right lower lobe lung mass. EXAM: PORTABLE CHEST 1 VIEW COMPARISON:  Imaging during CT-guided biopsy earlier today. FINDINGS: Follow-up chest x-ray shows no evidence of pneumothorax. Large right lower lobe lung mass again noted. There is no evidence of pulmonary edema, consolidation or pleural fluid. The heart size and mediastinal contours are within normal limits. IMPRESSION: No pneumothorax or other acute findings following right lung biopsy. Electronically Signed   By: Aletta Edouard M.D.   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  PLAN:    1. 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  PET scan results from  March 17, 2018 reviewed independently and report as above confirming metastatic disease and bilateral adrenal glands, retroperitoneal lymphadenopathy, and isolated focus in patient's right femur.  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  Hospice and end-of-life were briefly discussed, the patient does not wish to pursue this at this time.  Patient will receive palliative carboplatinum, Taxol, and Keytruda every 21 days for a total of 4 cycles and then proceed with maintenance Keytruda until intolerable side effects or progression of disease.  Patient will also require Udenyca for white blood cell count support on his first 4 cycles.  Prior to initiating treatment, patient will have a port placed.  Return to clinic on April 08, 2018 for further evaluation and initiation of cycle 1. 2.  Brain metastasis: Patient was given a referral to radiation oncology and has an appointment next week. 3.  Right femur lesion: Radiation oncology referral as above. 4.  Nausea: Likely multifactorial including possible brain mets.  Continue current treatment and consider Decadron in the future.    I spent a total of 30 minutes face-to-face with the patient of which greater than 50% of the visit was spent in counseling and coordination of care as detailed above.  Patient expressed understanding and was in agreement with this plan. He also understands that He can call clinic at any time with any questions, concerns, or complaints.   Cancer Staging Squamous cell carcinoma lung, right [facility]) Staging form: Lung, AJCC 8th Edition - Clinical stage from 04/04/2018: Stage IV (cT4, cN3, pM1c) - Signed by Lloyd Huger, MD on 04/04/2018   Lloyd Huger, MD   04/04/2018 8:53 AM

## 2018-03-29 LAB — SURGICAL PATHOLOGY

## 2018-03-30 ENCOUNTER — Encounter: Payer: Self-pay | Admitting: Oncology

## 2018-03-30 ENCOUNTER — Inpatient Hospital Stay (HOSPITAL_BASED_OUTPATIENT_CLINIC_OR_DEPARTMENT_OTHER): Payer: Commercial Managed Care - PPO | Admitting: Oncology

## 2018-03-30 ENCOUNTER — Encounter: Payer: Self-pay | Admitting: *Deleted

## 2018-03-30 VITALS — BP 125/75 | HR 73 | Temp 97.3°F | Resp 20 | Wt 179.0 lb

## 2018-03-30 DIAGNOSIS — C7971 Secondary malignant neoplasm of right adrenal gland: Secondary | ICD-10-CM | POA: Diagnosis not present

## 2018-03-30 DIAGNOSIS — C7972 Secondary malignant neoplasm of left adrenal gland: Secondary | ICD-10-CM

## 2018-03-30 DIAGNOSIS — C3490 Malignant neoplasm of unspecified part of unspecified bronchus or lung: Secondary | ICD-10-CM

## 2018-03-30 DIAGNOSIS — C7931 Secondary malignant neoplasm of brain: Secondary | ICD-10-CM

## 2018-03-30 DIAGNOSIS — R110 Nausea: Secondary | ICD-10-CM

## 2018-03-30 DIAGNOSIS — Z79899 Other long term (current) drug therapy: Secondary | ICD-10-CM

## 2018-03-30 DIAGNOSIS — F17210 Nicotine dependence, cigarettes, uncomplicated: Secondary | ICD-10-CM

## 2018-03-30 DIAGNOSIS — R590 Localized enlarged lymph nodes: Secondary | ICD-10-CM

## 2018-03-30 DIAGNOSIS — Z7189 Other specified counseling: Secondary | ICD-10-CM

## 2018-03-30 DIAGNOSIS — C3491 Malignant neoplasm of unspecified part of right bronchus or lung: Secondary | ICD-10-CM | POA: Diagnosis not present

## 2018-03-30 DIAGNOSIS — C3431 Malignant neoplasm of lower lobe, right bronchus or lung: Secondary | ICD-10-CM | POA: Diagnosis not present

## 2018-03-30 NOTE — Progress Notes (Signed)
Patient here today for follow up lung mass, results. Patient reports nausea, has zofran but still continued to have nausea and vomiting at times.

## 2018-03-30 NOTE — Progress Notes (Addendum)
Pathway note entered in error.  Patient receiving carboplatinum, Taxol, and Keytruda.

## 2018-03-31 NOTE — Progress Notes (Signed)
  Oncology Nurse Navigator Documentation  Navigator Location: [facility]Med [facility]c (03/30/18 1600)   )Navigator Encounter Type: Diagnostic Results;Follow-up Appt (03/30/18 1600)     Confirmed Diagnosis Date: 03/29/18 (03/30/18 1600)               Patient Visit Type: MedOnc (03/30/18 1600) Treatment Phase: Pre-Tx/Tx Discussion (03/30/18 1600) Barriers/Navigation Needs: Education;Coordination of Care (03/30/18 1600) Education: Understanding Cancer/ Treatment Options;Newly Diagnosed Cancer Education (03/30/18 1600) Interventions: Referrals;Coordination of Care;Education (03/30/18 1600) Referrals: Social Work (03/30/18 1600) Coordination of Care: Appts;Radiology;Chemo (03/30/18 1600) Education Method: Verbal;Written (03/30/18 1600)       met with patient during follow up visit with Dr. Grayland Ormond to review pathology results and discuss treatment options. All questions answered at the time of visit. Pt and family given resources regarding diagnosis and supportive services available. Pt informed that will make referral to [facility], [facility] to discuss financial concerns and disability. Referal sent to [facility] for second opinion per pt request. Reviewed upcoming appts with patient and family and informed to expect phone call from vascular surgeon's office with appt info for port placement. Pt and family verbalized understanding. Nothing further needed at this time.         Time Spent with Patient: 60 (03/30/18 1600)

## 2018-04-02 ENCOUNTER — Ambulatory Visit
Admission: RE | Admit: 2018-04-02 | Discharge: 2018-04-02 | Disposition: A | Discharge status: Home / Self Care | Payer: Commercial Managed Care - PPO | Source: Ambulatory Visit | Attending: Oncology | Admitting: Oncology

## 2018-04-02 ENCOUNTER — Other Ambulatory Visit: Payer: Self-pay | Admitting: *Deleted

## 2018-04-02 ENCOUNTER — Inpatient Hospital Stay: Payer: Commercial Managed Care - PPO

## 2018-04-02 ENCOUNTER — Telehealth: Payer: Self-pay | Admitting: *Deleted

## 2018-04-02 ENCOUNTER — Inpatient Hospital Stay (HOSPITAL_BASED_OUTPATIENT_CLINIC_OR_DEPARTMENT_OTHER): Payer: Commercial Managed Care - PPO | Admitting: Oncology

## 2018-04-02 ENCOUNTER — Encounter (INDEPENDENT_AMBULATORY_CARE_PROVIDER_SITE_OTHER): Payer: Self-pay

## 2018-04-02 VITALS — BP 123/69 | HR 78 | Temp 96.5°F | Resp 16 | Wt 172.0 lb

## 2018-04-02 DIAGNOSIS — R112 Nausea with vomiting, unspecified: Secondary | ICD-10-CM

## 2018-04-02 DIAGNOSIS — F17210 Nicotine dependence, cigarettes, uncomplicated: Secondary | ICD-10-CM

## 2018-04-02 DIAGNOSIS — R590 Localized enlarged lymph nodes: Secondary | ICD-10-CM

## 2018-04-02 DIAGNOSIS — I639 Cerebral infarction, unspecified: Secondary | ICD-10-CM | POA: Insufficient documentation

## 2018-04-02 DIAGNOSIS — C7971 Secondary malignant neoplasm of right adrenal gland: Secondary | ICD-10-CM | POA: Diagnosis not present

## 2018-04-02 DIAGNOSIS — C3490 Malignant neoplasm of unspecified part of unspecified bronchus or lung: Secondary | ICD-10-CM | POA: Insufficient documentation

## 2018-04-02 DIAGNOSIS — R937 Abnormal findings on diagnostic imaging of other parts of musculoskeletal system: Secondary | ICD-10-CM

## 2018-04-02 DIAGNOSIS — Z79899 Other long term (current) drug therapy: Secondary | ICD-10-CM

## 2018-04-02 DIAGNOSIS — R918 Other nonspecific abnormal finding of lung field: Secondary | ICD-10-CM

## 2018-04-02 DIAGNOSIS — C3431 Malignant neoplasm of lower lobe, right bronchus or lung: Secondary | ICD-10-CM | POA: Diagnosis not present

## 2018-04-02 DIAGNOSIS — C7972 Secondary malignant neoplasm of left adrenal gland: Secondary | ICD-10-CM

## 2018-04-02 DIAGNOSIS — C7931 Secondary malignant neoplasm of brain: Secondary | ICD-10-CM | POA: Insufficient documentation

## 2018-04-02 DIAGNOSIS — E860 Dehydration: Secondary | ICD-10-CM

## 2018-04-02 DIAGNOSIS — R110 Nausea: Secondary | ICD-10-CM

## 2018-04-02 LAB — CBC WITH DIFFERENTIAL/PLATELET
BASOS ABS: 0.1 10*3/uL (ref 0–0.1)
BASOS PCT: 1 %
EOS ABS: 0.3 10*3/uL (ref 0–0.7)
EOS PCT: 3 %
HEMATOCRIT: 32.2 % — AB (ref 40.0–52.0)
Hemoglobin: 10.5 g/dL — ABNORMAL LOW (ref 13.0–18.0)
Lymphocytes Relative: 15 %
Lymphs Abs: 1.3 10*3/uL (ref 1.0–3.6)
MCH: 25 pg — ABNORMAL LOW (ref 26.0–34.0)
MCHC: 32.7 g/dL (ref 32.0–36.0)
MCV: 76.6 fL — ABNORMAL LOW (ref 80.0–100.0)
MONO ABS: 0.7 10*3/uL (ref 0.2–1.0)
MONOS PCT: 9 %
NEUTROS ABS: 6.1 10*3/uL (ref 1.4–6.5)
Neutrophils Relative %: 72 %
PLATELETS: 374 10*3/uL (ref 150–440)
RBC: 4.2 MIL/uL — ABNORMAL LOW (ref 4.40–5.90)
RDW: 16.2 % — AB (ref 11.5–14.5)
WBC: 8.5 10*3/uL (ref 3.8–10.6)

## 2018-04-02 LAB — COMPREHENSIVE METABOLIC PANEL
ALT: 14 U/L (ref 0–44)
AST: 24 U/L (ref 15–41)
Albumin: 3.1 g/dL — ABNORMAL LOW (ref 3.5–5.0)
Alkaline Phosphatase: 97 U/L (ref 38–126)
Anion gap: 10 (ref 5–15)
BUN: 20 mg/dL (ref 8–23)
CALCIUM: 9.1 mg/dL (ref 8.9–10.3)
CHLORIDE: 101 mmol/L (ref 98–111)
CO2: 26 mmol/L (ref 22–32)
CREATININE: 0.93 mg/dL (ref 0.61–1.24)
GFR calc Af Amer: >60 mL/min (ref >60)
GFR calc non Af Amer: >60 mL/min (ref >60)
Glucose, Bld: 123 mg/dL — ABNORMAL HIGH (ref 70–99)
Potassium: 4.8 mmol/L (ref 3.5–5.1)
Sodium: 137 mmol/L (ref 135–145)
Total Bilirubin: 0.7 mg/dL (ref 0.3–1.2)
Total Protein: 8.1 g/dL (ref 6.5–8.1)

## 2018-04-02 LAB — MAGNESIUM: MAGNESIUM: 2.3 mg/dL (ref 1.7–2.4)

## 2018-04-02 MED ORDER — ONDANSETRON HCL 4 MG/2ML IJ SOLN
8.0000 mg | Freq: Once | INTRAMUSCULAR | Status: AC
  Administered 2018-04-02: 8 mg via INTRAVENOUS
  Filled 2018-04-02: qty 4

## 2018-04-02 MED ORDER — SODIUM CHLORIDE 0.9 % IV SOLN: Freq: Once | INTRAVENOUS | Status: DC

## 2018-04-02 MED ORDER — SODIUM CHLORIDE 0.9 % IV SOLN
Freq: Once | INTRAVENOUS | Status: AC
  Administered 2018-04-02: 14:00:00 via INTRAVENOUS
  Filled 2018-04-02: qty 1000

## 2018-04-02 MED ORDER — IOPAMIDOL (ISOVUE-300) INJECTION 61%
75.0000 mL | Freq: Once | INTRAVENOUS | Status: AC | PRN
  Administered 2018-04-02: 75 mL via INTRAVENOUS

## 2018-04-02 MED ORDER — DEXAMETHASONE 4 MG PO TABS: 4.0000 mg | ORAL_TABLET | Freq: Two times a day (BID) | ORAL | 0 refills | Status: AC

## 2018-04-02 MED ORDER — GADOBENATE DIMEGLUMINE 529 MG/ML IV SOLN
15.0000 mL | Freq: Once | INTRAVENOUS | Status: AC | PRN
  Administered 2018-04-02: 15 mL via INTRAVENOUS

## 2018-04-02 MED ORDER — LORAZEPAM 1 MG PO TABS: 1.0000 mg | ORAL_TABLET | Freq: Once | ORAL | 0 refills | Status: AC

## 2018-04-02 MED ORDER — DEXAMETHASONE SODIUM PHOSPHATE 10 MG/ML IJ SOLN
10.0000 mg | Freq: Once | INTRAMUSCULAR | Status: AC
  Administered 2018-04-02: 10 mg via INTRAVENOUS
  Filled 2018-04-02: qty 1

## 2018-04-02 MED ORDER — BENZONATATE 100 MG PO CAPS: 100.0000 mg | ORAL_CAPSULE | Freq: Three times a day (TID) | ORAL | 1 refills | Status: AC | PRN

## 2018-04-02 NOTE — Progress Notes (Signed)
Symptom Management Consult note [facility]  [phone] [phone]  Patient Care Team: [place] Pink, MD as PCP - General (Family Medicine) Telford Nab, RN as Registered Nurse   Name of the patient: Micheal Decker  [id number]  December 16, 1955   Date of visit: 04/02/18  Diagnosis- Right lower lobe lung mass  Chief complaint/ Reason for visit- Nausea/vomiting  Heme/Onc history: Patient was last seen by primary medical oncologist Dr. Grayland Ormond on 03/30/2018 where they discussed biopsy results and continued treatment planning.  At that visit he continued to lose weight and had persistent nausea but otherwise felt well.  He wished to pursue palliative chemotherapy with carbo/Taxol and Keytruda every 21 days for a total of 4 cycles and then proceed with maintenance Keytruda.  He was scheduled to return to clinic on 04/08/2018 for initiation of cycle 1.  Oncology History   Patient is a 62 year old male with a chronic cough who had acute onset chest pain approximately 2 weeks ago.  His pain has since resolved. Chest x-ray and subsequent CT scan revealed a right lower lobe lung mass highly suspicious for malignancy.  Pathology results reviewed and discussed with pathologist confirming malignancy.  PET scan results from March 17, 2018 reviewed confirming metastatic disease and bilateral adrenal glands, retroperitoneal lymphadenopathy, and isolated focus in patient's right femur.  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   Referred to radiation. Started in Decadron 4 mg BID.   Hospice and end-of-life were briefly discussed, the patient does not wish to pursue this at this time.  Patient will receive palliative carboplatinum, Taxol, and Keytruda every 21 days for a total of 4 cycles and then proceed with maintenance Keytruda until intolerable side effects or progression of disease.  Patient will also require Udenyca  for white blood cell count support on his first 4 cycles.       Squamous cell carcinoma lung, right ([place])   04/04/2018 Initial Diagnosis    Squamous cell carcinoma lung, right ([place])      04/04/2018 Cancer Staging    Staging form: Lung, AJCC 8th Edition - Clinical stage from 04/04/2018: Stage IV (cT4, cN3, pM1c) - Signed by Lloyd Huger, MD on 04/04/2018      04/04/2018 -  Chemotherapy    The patient had palonosetron (ALOXI) injection 0.25 mg, 0.25 mg, Intravenous,  Once, 0 of 4 cycles pegfilgrastim-cbqv (UDENYCA) injection 6 mg, 6 mg, Subcutaneous, Once, 0 of 4 cycles CARBOplatin (PARAPLATIN) 690 mg in sodium chloride 0.9 % 250 mL chemo infusion, 690 mg (100 % of original dose 688.2 mg), Intravenous,  Once, 0 of 4 cycles Dose modification:   (original dose 688.2 mg, Cycle 1) PACLitaxel (TAXOL) 342 mg in sodium chloride 0.9 % 500 mL chemo infusion (> 80mg /m2), 175 mg/m2 = 342 mg (100 % of original dose 175 mg/m2), Intravenous,  Once, 0 of 4 cycles Dose modification: 175 mg/m2 (original dose 175 mg/m2, Cycle 1, Reason: Provider Judgment) pembrolizumab (KEYTRUDA) 200 mg in sodium chloride 0.9 % 50 mL chemo infusion, 200 mg, Intravenous, Once, 0 of 8 cycles fosaprepitant (EMEND) 150 mg, dexamethasone (DECADRON) 12 mg in sodium chloride 0.9 % 145 mL IVPB, , Intravenous,  Once, 0 of 4 cycles  for chemotherapy treatment.         Interval history-  Patient presents with nausea and vomiting. He has had these symptoms for 2 weeks  He describes these symptoms as intractable. He describes  the severity of his symptoms as severe  his symptoms occur All the time He denies a history of chest pain, chills, dizziness, fevers and shortness of breath.  He has not had sick contacts.  He has tried over-the-counter medications. If so these include: Zofran and Compazine He reports that these have been of benefit: no  ECOG FS:1 - Symptomatic but completely ambulatory  Review of systems- Review of  Systems  Constitutional: Positive for malaise/fatigue and weight loss. Negative for chills and fever.  HENT: Negative for congestion and ear pain.   Eyes: Negative.  Negative for blurred vision and double vision.  Respiratory: Negative.  Negative for cough, sputum production and shortness of breath.   Cardiovascular: Negative.  Negative for chest pain, palpitations and leg swelling.  Gastrointestinal: Positive for nausea and vomiting. Negative for abdominal pain, constipation and diarrhea.  Genitourinary: Negative for dysuria, frequency and urgency.  Musculoskeletal: Negative for back pain and falls.  Skin: Negative.  Negative for rash.  Neurological: Positive for headaches. Negative for weakness.  Endo/Heme/Allergies: Negative.  Does not bruise/bleed easily.  Psychiatric/Behavioral: Negative.  Negative for depression. The patient is not nervous/anxious and does not have insomnia.      Current treatment- Still in work-up stage. No treatment to date.   No Known Allergies   No past medical history on file.   Past Surgical History:  Procedure Laterality Date  . KNEE SURGERY      Social History   Socioeconomic History  . Marital status: Married    Spouse name: Not on file  . Number of children: Not on file  . Years of education: Not on file  . Highest education level: Not on file  Occupational History  . Not on file  Social Needs  . Financial resource strain: Not on file  . Food insecurity:    Worry: Not on file    Inability: Not on file  . Transportation needs:    Medical: Not on file    Non-medical: Not on file  Tobacco Use  . Smoking status: Current Every Day Smoker    Packs/day: 1.50    Types: Cigarettes  . Smokeless tobacco: Never Used  Substance and Sexual Activity  . Alcohol use: Not Currently    Comment: quit drinking 2018  . Drug use: Never  . Sexual activity: Not on file  Lifestyle  . Physical activity:    Days per week: Not on file    Minutes per  session: Not on file  . Stress: Not on file  Relationships  . Social connections:    Talks on phone: Not on file    Gets together: Not on file    Attends religious service: Not on file    Active member of club or organization: Not on file    Attends meetings of clubs or organizations: Not on file    Relationship status: Not on file  . Intimate partner violence:    Fear of current or ex partner: Not on file    Emotionally abused: Not on file    Physically abused: Not on file    Forced sexual activity: Not on file  Other Topics Concern  . Not on file  Social History Narrative  . Not on file    Family History  Problem Relation Age of Onset  . Stroke Mother   . Heart attack Father   . Diabetes Sister   . Heart attack Sister   . Cancer Sister  Current Outpatient Medications:  .  benzonatate (TESSALON) 100 MG capsule, Take 1 capsule (100 mg total) by mouth 3 (three) times daily as needed for cough., Disp: 30 capsule, Rfl: 1 .  cetirizine (ZYRTEC) 10 MG tablet, Take 1 tablet by mouth daily., Disp: , Rfl:  .  meloxicam (MOBIC) 15 MG tablet, Take 15 mg by mouth daily., Disp: , Rfl: 1 .  montelukast (SINGULAIR) 10 MG tablet, Take 1 tablet by mouth every evening., Disp: , Rfl:  .  omeprazole (PRILOSEC) 20 MG capsule, Take 2 capsules (40 mg total) by mouth daily., Disp: 60 capsule, Rfl: 11 .  ondansetron (ZOFRAN) 8 MG tablet, Take 1 tablet (8 mg total) by mouth 3 (three) times daily as needed for nausea or vomiting., Disp: 30 tablet, Rfl: 1 .  prochlorperazine (COMPAZINE) 25 MG suppository, Place 1 suppository (25 mg total) rectally every 12 (twelve) hours as needed for nausea or vomiting., Disp: 12 suppository, Rfl: 1 .  tadalafil (CIALIS) 10 MG tablet, Take 10 mg by mouth daily as needed for erectile dysfunction., Disp: , Rfl:  .  dexamethasone (DECADRON) 4 MG tablet, Take 1 tablet (4 mg total) by mouth 2 (two) times daily with a meal., Disp: 60 tablet, Rfl: 0 .   lidocaine-prilocaine (EMLA) cream, Apply to affected area once, Disp: 30 g, Rfl: 3  Physical exam:  Vitals:   04/02/18 1346  BP: 123/69  Pulse: 78  Resp: 16  Temp: (!) 96.5 F (35.8 C)  TempSrc: Tympanic  SpO2: 97%  Weight: 172 lb (78 kg)   Physical Exam  Constitutional: He is oriented to person, place, and time. Vital signs are normal. He appears well-developed and well-nourished.  HENT:  Head: Normocephalic and atraumatic.  Eyes: Pupils are equal, round, and reactive to light.  Neck: Normal range of motion.  Cardiovascular: Normal rate, regular rhythm and normal heart sounds.  No murmur heard. Pulmonary/Chest: Effort normal and breath sounds normal. He has no wheezes.  Abdominal: Soft. Normal appearance and bowel sounds are normal. He exhibits no distension. There is no tenderness.  Musculoskeletal: Normal range of motion. He exhibits no edema.  Neurological: He is alert and oriented to person, place, and time.  Skin: Skin is warm and dry. No rash noted. There is pallor.  Psychiatric: Judgment normal.     CMP Latest Ref Rng & Units 04/02/2018  Glucose 70 - 99 mg/dL 123(H)  BUN 8 - 23 mg/dL 20  Creatinine 0.61 - 1.24 mg/dL 0.93  Sodium 135 - 145 mmol/L 137  Potassium 3.5 - 5.1 mmol/L 4.8  Chloride 98 - 111 mmol/L 101  CO2 22 - 32 mmol/L 26  Calcium 8.9 - 10.3 mg/dL 9.1  Total Protein 6.5 - 8.1 g/dL 8.1  Total Bilirubin 0.3 - 1.2 mg/dL 0.7  Alkaline Phos 38 - 126 U/L 97  AST 15 - 41 U/L 24  ALT 0 - 44 U/L 14   CBC Latest Ref Rng & Units 04/02/2018  WBC 3.8 - 10.6 K/uL 8.5  Hemoglobin 13.0 - 18.0 g/dL 10.5(L)  Hematocrit 40.0 - 52.0 % 32.2(L)  Platelets 150 - 440 K/uL 374    No images are attached to the encounter.  Ct Head W Wo Contrast  Result Date: 04/02/2018 CLINICAL DATA:  Metastatic lung cancer staging EXAM: CT HEAD WITHOUT AND WITH CONTRAST TECHNIQUE: Contiguous axial images were obtained from the base of the skull through the vertex without and with  intravenous contrast CONTRAST:  59mL ISOVUE-300 IOPAMIDOL (ISOVUE-300) INJECTION 61% COMPARISON:  None. FINDINGS: Brain: 2 metastatic deposits in the left cerebellum. Ring-enhancing lesion measuring 15 mm in the mid cerebellum and 6 mm enhancing lesion posterior left cerebellum. Moderate left cerebellar edema with mass-effect on the fourth ventricle. No midline shift or obstructive hydrocephalus. No other enhancing metastatic deposits. Ventricle size normal.  No acute infarct or hemorrhage Vascular: Negative for hyperdense vessel. Normal vascular enhancement Skull: Negative for skull lesion however there is an enhancing lesion in the left parietal scalp adjacent to the calvarium which may be due to metastatic disease measuring approximately 6 x 12 mm. Sinuses/Orbits: Negative Other: None IMPRESSION: Metastatic disease left cerebellum with surrounding edema and mild mass effect. No other enhancing lesions Enhancing lesion left parietal scalp, suspicious for metastatic disease to the soft tissues without bony involvement. These results will be called to the ordering clinician or representative by the Radiologist Assistant, and communication documented in the PACS or zVision Dashboard. Electronically Signed   By: Franchot Gallo M.D.   On: 04/02/2018 11:31   Ct Chest Wo Contrast  Result Date: 03/08/2018 CLINICAL DATA:  Outside chest radiograph demonstrating right lower lobe lung mass. Productive cough for 3 weeks with right lower chest pain. 35 x 1.5 pack-year smoking history. EXAM: CT CHEST WITHOUT CONTRAST TECHNIQUE: Multidetector CT imaging of the chest was performed following the standard protocol without IV contrast. COMPARISON:  Report of chest radiograph 03/02/2018.  No prior CT. FINDINGS: Cardiovascular: Aortic and branch vessel atherosclerosis. Normal heart size, without pericardial effusion. Multivessel coronary artery atherosclerosis. Aortic valve calcification. Mediastinum/Nodes: Multiple small left low  jugular/supraclavicular nodes. The largest measures 10 mm on image 6/2. A right lower mediastinal node measures 1.5 cm on image 83/2. No left hilar adenopathy. Right retrocrural node of 10 mm on image 133/2. Lungs/Pleura: No pleural fluid.  Mild centrilobular emphysema. Pleural-based right lower lobe lung mass with extensive contact of the right major fissure. This measures 7.7 x 7.5 cm on image 92/3. No osseous destruction. There is suggestion of chest wall involvement, as evidenced by ill definition of fat planes between the seventh and eighth right ribs. Example image 95/2. There is direct extension of tumor versus contiguous adenopathy in the right infrahilar region. This area is difficult to differentiate from right pulmonary artery branches, including on image 87/2. Upper Abdomen: Normal imaged portions of the liver, spleen, pancreas, kidneys. Proximal gastric underdistention. Right larger than left adrenal lesions. Example at 2.5 x 2.3 cm on the left. 4.5 x 3.2 cm on the right. Prominent retroperitoneal nodes, including at 12 mm left peritoneal on image 179/2. Musculoskeletal: No acute osseous abnormality. IMPRESSION: 1. Right lower lobe primary bronchogenic carcinoma. Suspicion of small volume chest wall involvement, especially given the clinical history of chest pain. 2. Direct tumor extension and/or adenopathy in the right infrahilar region. Suspicious nodes within the mediastinum, low neck, and upper abdomen. 3. Bilateral adrenal masses, suspicious for metastatic disease. 4. Aortic atherosclerosis (ICD10-I70.0), coronary artery atherosclerosis and emphysema (ICD10-J43.9). Electronically Signed   By: Abigail Miyamoto M.D.   On: 03/08/2018 14:31   Mr Jeri Cos KD Contrast  Result Date: 04/02/2018 CLINICAL DATA:  Follow up LEFT cerebellar mass. Nausea and vomiting today. History of lung cancer. EXAM: MRI HEAD WITHOUT AND WITH CONTRAST TECHNIQUE: Multiplanar, multiecho pulse sequences of the brain and  surrounding structures were obtained without and with intravenous contrast. CONTRAST:  23mL MULTIHANCE GADOBENATE DIMEGLUMINE 529 MG/ML IV SOLN COMPARISON:  CT HEAD with and without contrast April 02, 2018. FINDINGS: INTRACRANIAL CONTENTS: Subcentimeter reduced diffusion RIGHT  mesial cerebellum with low ADC values. No susceptibility artifact to suggest hemorrhage with particular attention to LEFT cerebellar nodules. 15 x 14 mm thick-walled enhancing LEFT cerebellar nodule (series 10, image 44. Similar MR characteristics 5 x 8 mm LEFT cerebellar nodule (series 10, image 33).Regional mass effect without midline shift. LEFT parietoccipital focal dural enhancement subjacent to enhancing scalp nodule. No supratentorial abnormal enhancement. Ventricles and sulci are normal for patient's age. No abnormal extra-axial fluid collections. VASCULAR: Normal major intracranial vascular flow voids present at skull base. SKULL AND UPPER CERVICAL SPINE: No abnormal sellar expansion. Multiple low signal osseous lesions most notable in the clivus and mandible condyles. Craniocervical junction maintained. Reduced diffusion and enhancement 18 x 7 mm LEFT scalp nodule seen on today's CT (series 10, image 88). No underlying calvarial enhancement. SINUSES/ORBITS: The mastoid air-cells and included paranasal sinuses are well-aerated.The included ocular globes and orbital contents are non-suspicious. OTHER: RIGHT nasopharyngeal mucosal retention cyst. IMPRESSION: 1. Acute subcentimeter RIGHT cerebellar infarct. 2. Two LEFT cerebellar metastasis measuring to 15 mm. Regional mass effect without midline shift. 3. 18 x 7 mm LEFT parietoccipital scalp nodule, subjacent dural enhancement concerning calvarial metastasis. 4. Mandible and central skull base suspected osseous metastasis. Consider bone scan. 5. These results will be called to the ordering clinician or representative by the Radiologist Assistant, and communication documented in the PACS  or zVision Dashboard. Electronically Signed   By: Elon Alas M.D.   On: 04/02/2018 19:46   Nm Pet Image Initial (pi) Skull Base To Thigh  Result Date: 03/17/2018 CLINICAL DATA:  Initial treatment strategy for right lower lobe lung mass and adenopathy. EXAM: NUCLEAR MEDICINE PET SKULL BASE TO THIGH TECHNIQUE: 10.56 mCi F-18 FDG was injected intravenously. Full-ring PET imaging was performed from the skull base to thigh after the radiotracer. CT data was obtained and used for attenuation correction and anatomic localization. Fasting blood glucose: 107 mg/dl COMPARISON:  Chest CT 03/08/2018 FINDINGS: Mediastinal blood pool activity: SUV max 1.63 NECK: 10 mm supraclavicular lymph node on the left side is hypermetabolic with SUV max of 7.82. No other neck or supraclavicular adenopathy. No axillary adenopathy. Incidental CT findings: none CHEST: The large right lower lobe lung mass is hypermetabolic with SUV max of 42.35. 19 mm right infrahilar lymph node is hypermetabolic with SUV max of 7.6. Right-sided subcarinal lymph node is also hypermetabolic with SUV max of 3.61. 7.5 mm lymph node between the aorta and the esophagus on image number 93 is mildly hypermetabolic with SUV max of 4.43. Retrocrural lymph node measuring 8 mm on image number 154 is hypermetabolic with SUV max of 0.08. No hypermetabolic metastatic pulmonary nodules. No pleural disease. Incidental CT findings: none ABDOMEN/PELVIS: Bilateral adrenal gland masses are hypermetabolic. The 4.7 cm right adrenal gland mass has an SUV max of 10.3 and the 3 cm left adrenal gland mass has an SUV max of 11.51. Bilateral retroperitoneal lymphadenopathy is hypermetabolic and consistent with metastatic disease. Index nodal lesion in the left para-aortic region on image number 174 measures 2.3 x 2.2 cm and SUV max is 8.22 15 mm right common iliac lymph node on the right side on image number 216 has an SUV max of 6.92. Small bilateral external iliac lymph nodes  are hypermetabolic and consistent with metastasis. 10.5 mm left inguinal lymph node on image number 267 has an SUV max of 3.59 Incidental CT findings: none SKELETON: Area of hypermetabolism involving the right hip near the base of the femoral neck has an SUV max of  4.58. On the CT scan there is Fe increased density in this area. No other definite metastatic bone lesions are identified. Areas of hypermetabolism around both shoulders likely related to muscle injuries or possible injections. Incidental CT findings: none IMPRESSION: 1. 7.5 cm right lower lobe lung hypermetabolic mass consistent with primary lung neoplasm. There is adjacent right infrahilar and subcarinal metastatic adenopathy. 2. Metastatic left supraclavicular adenopathy, retrocrural adenopathy, retroperitoneal lymphadenopathy, pelvic lymphadenopathy and bilateral adrenal gland metastatic disease. 3. Focus of hypermetabolism in the right hip worrisome for metastatic disease. Electronically Signed   By: Marijo Sanes M.D.   On: 03/17/2018 14:36   Ct Biopsy  Result Date: 03/25/2018 CLINICAL DATA:  Large right lower lobe lung mass and evidence by prior imaging of widespread metastatic disease. Biopsy of the right lung mass is performed to establish tissue diagnosis. EXAM: CT GUIDED CORE BIOPSY OF RIGHT LUNG MASS ANESTHESIA/SEDATION: 1.0 mg IV Versed; 50 mcg IV Fentanyl Total Moderate Sedation Time:  19 minutes. The patient's level of consciousness and physiologic status were continuously monitored during the procedure by Radiology nursing. PROCEDURE: The procedure risks, benefits, and alternatives were explained to the patient. Questions regarding the procedure were encouraged and answered. The patient understands and consents to the procedure. A time-out was performed prior to initiating the procedure. The patient was placed in a prone position and CT performed through the lower lung zones. The right posterior chest wall was prepped with  chlorhexidine in a sterile fashion, and a sterile drape was applied covering the operative field. A sterile gown and sterile gloves were used for the procedure. Local anesthesia was provided with 1% Lidocaine. Under CT guidance, a 17 gauge trocar needle was advanced into a right lower lobe lung mass. After confirming needle tip position, 2 separate 18 gauge core biopsy samples were obtained and samples submitted in formalin. Additional CT was performed. Aspiration of air was performed in the pleural space via the outer 17 gauge needle. Additional CT was performed. COMPLICATIONS: Small right posterior pneumothorax following lung biopsy. SIR level B: Nominal therapy (including overnight admission for observation), no consequence. FINDINGS: The large posterior right lower lobe lung mass measures approximately 8.3 cm in greatest diameter by current CT. Biopsy yielded solid tissue. Immediately following the second core biopsy sample, CT demonstrates development of a small posterior pneumothorax of roughly 10% volume. This was able to be completely aspirated via the outer 17 gauge needle with additional follow-up CT demonstrating no significant residual air in the pleural space. This will be further followed during recovery by chest x-ray. IMPRESSION: CT-guided core biopsy performed of large 8.3 cm right lower lobe lung mass. The procedure was complicated by a small posterior pneumothorax treated by suction aspiration via the outer 17 gauge needle. This resulted in resolution of pneumothorax by CT. A follow-up chest x-ray will be performed during recovery. Electronically Signed   By: Aletta Edouard M.D.   On: 03/25/2018 12:14   Dg Chest Port 1 View  Result Date: 03/25/2018 CLINICAL DATA:  Right pneumothorax after biopsy of right lower lobe lung mass. EXAM: PORTABLE CHEST 1 VIEW COMPARISON:  Imaging during CT-guided biopsy earlier today. FINDINGS: Follow-up chest x-ray shows no evidence of pneumothorax. Large right  lower lobe lung mass again noted. There is no evidence of pulmonary edema, consolidation or pleural fluid. The heart size and mediastinal contours are within normal limits. IMPRESSION: No pneumothorax or other acute findings following right lung biopsy. Electronically Signed   By: Jenness Corner.D.  On: 03/25/2018 11:58    Assessment and plan- Patient is a 62 y.o. male who presents for intractable nausea and vomiting.  1. Stave IV lung cancer: Recent diagnosis.  Had CT of chest without contrast on 03/08/2018 revealing right lower lobe primary bronchogenic carcinoma, bilateral adrenal masses suspicious for metastatic disease.  Had PET completed on 03/17/2018 revealing 7.5 cm right lower lobe lung mass with right sub-cardial metastatic adenopathy.  Metastatic left supraclavicular adenopathy, retro-crural adenopathy retroperitoneal lymphadenopathy, pelvic lymphadenopathy and bilateral adrenal gland metastatic disease.  Biopsy was completed on 03/25/2018 revealing non-small cell lung cancer.  CT of the head this morning revealed metastatic disease in the left cerebellum with surrounding edema and mild mass effect.  No other enhancing lesions.   2. Nausea/Vomiting: Recent CT head revealed metastatic disease left cerebellum with surrounding edema and mild mass effect. No other enhancing lesions. Nausea Likely d/t brain leisons.  Consulted with Dr. Donella Stade and he recommends Decadron 4 mg twice daily, MRI of brain with contrast and an appointment to see him on Monday at 15 for consultation and simulation. Patient also has chemo education on Monday.  He is scheduled to start Keytruda, Alimta and Carbo on Thursday when he will see Dr. Grayland Ormond with labs.  Has a scheduled appointment at [facility] on Wednesday for second opinion.   Needs MRI of brain with contrast for simulation per Dr. Donella Stade. Scheduled for tonight at 6 pm.   Patient scheduled for port placement after first chemo.    Visit Diagnosis 1.  Malignant neoplasm of lung, unspecified laterality, unspecified part of lung ([place])   2. Nausea and vomiting, intractability of vomiting not specified, unspecified vomiting type     Patient expressed understanding and was in agreement with this plan. He also understands that He can call clinic at any time with any questions, concerns, or complaints.   Greater than 50% was spent in counseling and coordination of care with this patient including but not limited to discussion of the relevant topics above (See A&P) including, but not limited to diagnosis and management of acute and chronic medical conditions.    Faythe Casa, AGNP-C [facility]- [phone] Pager- [phone] 04/06/2018 10:49 AM

## 2018-04-02 NOTE — Telephone Encounter (Signed)
Debra from [facility] would like to discuss patients studies that have been ordered.  [phone]

## 2018-04-02 NOTE — Telephone Encounter (Signed)
Call received to refill benzonatate capsules. Refill escribed to pharmacy. Pt's wife made aware.

## 2018-04-02 NOTE — Telephone Encounter (Signed)
Call returned, left vm.

## 2018-04-02 NOTE — Patient Instructions (Addendum)
Pembrolizumab injection What is this medicine? PEMBROLIZUMAB (pem broe liz ue mab) is a monoclonal antibody. It is used to treat melanoma, head and neck cancer, Hodgkin lymphoma, non-small cell lung cancer, urothelial cancer, stomach cancer, and cancers that have a certain genetic condition. This medicine may be used for other purposes; ask your health care provider or pharmacist if you have questions. COMMON BRAND NAME(S): Keytruda What should I tell my health care provider before I take this medicine? They need to know if you have any of these conditions: -diabetes -immune system problems -inflammatory bowel disease -liver disease -lung or breathing disease -lupus -organ transplant -an unusual or allergic reaction to pembrolizumab, other medicines, foods, dyes, or preservatives -pregnant or trying to get pregnant -breast-feeding How should I use this medicine? This medicine is for infusion into a vein. It is given by a health care professional in a hospital or clinic setting. A special MedGuide will be given to you before each treatment. Be sure to read this information carefully each time. Talk to your pediatrician regarding the use of this medicine in children. While this drug may be prescribed for selected conditions, precautions do apply. Overdosage: If you think you have taken too much of this medicine contact a poison control center or emergency room at once. NOTE: This medicine is only for you. Do not share this medicine with others. What if I miss a dose? It is important not to miss your dose. Call your doctor or health care professional if you are unable to keep an appointment. What may interact with this medicine? Interactions have not been studied. Give your health care provider a list of all the medicines, herbs, non-prescription drugs, or dietary supplements you use. Also tell them if you smoke, drink alcohol, or use illegal drugs. Some items may interact with your  medicine. This list may not describe all possible interactions. Give your health care provider a list of all the medicines, herbs, non-prescription drugs, or dietary supplements you use. Also tell them if you smoke, drink alcohol, or use illegal drugs. Some items may interact with your medicine. What should I watch for while using this medicine? Your condition will be monitored carefully while you are receiving this medicine. You may need blood work done while you are taking this medicine. Do not become pregnant while taking this medicine or for 4 months after stopping it. Women should inform their doctor if they wish to become pregnant or think they might be pregnant. There is a potential for serious side effects to an unborn child. Talk to your health care professional or pharmacist for more information. Do not breast-feed an infant while taking this medicine or for 4 months after the last dose. What side effects may I notice from receiving this medicine? Side effects that you should report to your doctor or health care professional as soon as possible: -allergic reactions like skin rash, itching or hives, swelling of the face, lips, or tongue -bloody or black, tarry -breathing problems -changes in vision -chest pain -chills -constipation -cough -dizziness or feeling faint or lightheaded -fast or irregular heartbeat -fever -flushing -hair loss -low blood counts - this medicine may decrease the number of white blood cells, red blood cells and platelets. You may be at increased risk for infections and bleeding. -muscle pain -muscle weakness -persistent headache -signs and symptoms of high blood sugar such as dizziness; dry mouth; dry skin; fruity breath; nausea; stomach pain; increased hunger or thirst; increased urination -signs and symptoms of kidney  injury like trouble passing urine or change in the amount of urine -signs and symptoms of liver injury like dark urine, light-colored  stools, loss of appetite, nausea, right upper belly pain, yellowing of the eyes or skin -stomach pain -sweating -weight loss Side effects that usually do not require medical attention (report to your doctor or health care professional if they continue or are bothersome): -decreased appetite -diarrhea -tiredness This list may not describe all possible side effects. Call your doctor for medical advice about side effects. You may report side effects to FDA at 1-800-FDA-1088. Where should I keep my medicine? This drug is given in a hospital or clinic and will not be stored at home. NOTE: This sheet is a summary. It may not cover all possible information. If you have questions about this medicine, talk to your doctor, pharmacist, or health care provider.  2018 Elsevier/Gold Standard (2016-06-10 12:29:36) Pemetrexed injection What is this medicine? PEMETREXED (PEM e TREX ed) is a chemotherapy drug used to treat lung cancers like non-small cell lung cancer and mesothelioma. It may also be used to treat other cancers. This medicine may be used for other purposes; ask your health care provider or pharmacist if you have questions. COMMON BRAND NAME(S): Alimta What should I tell my health care provider before I take this medicine? They need to know if you have any of these conditions: -infection (especially a virus infection such as chickenpox, cold sores, or herpes) -kidney disease -low blood counts, like low white cell, platelet, or red cell counts -lung or breathing disease, like asthma -radiation therapy -an unusual or allergic reaction to pemetrexed, other medicines, foods, dyes, or preservative -pregnant or trying to get pregnant -breast-feeding How should I use this medicine? This drug is given as an infusion into a vein. It is administered in a hospital or clinic by a specially trained health care professional. Talk to your pediatrician regarding the use of this medicine in children.  Special care may be needed. Overdosage: If you think you have taken too much of this medicine contact a poison control center or emergency room at once. NOTE: This medicine is only for you. Do not share this medicine with others. What if I miss a dose? It is important not to miss your dose. Call your doctor or health care professional if you are unable to keep an appointment. What may interact with this medicine? This medicine may interact with the following medications: -Ibuprofen This list may not describe all possible interactions. Give your health care provider a list of all the medicines, herbs, non-prescription drugs, or dietary supplements you use. Also tell them if you smoke, drink alcohol, or use illegal drugs. Some items may interact with your medicine. What should I watch for while using this medicine? Visit your doctor for checks on your progress. This drug may make you feel generally unwell. This is not uncommon, as chemotherapy can affect healthy cells as well as cancer cells. Report any side effects. Continue your course of treatment even though you feel ill unless your doctor tells you to stop. In some cases, you may be given additional medicines to help with side effects. Follow all directions for their use. Call your doctor or health care professional for advice if you get a fever, chills or sore throat, or other symptoms of a cold or flu. Do not treat yourself. This drug decreases your body's ability to fight infections. Try to avoid being around people who are sick. This medicine may increase your  risk to bruise or bleed. Call your doctor or health care professional if you notice any unusual bleeding. Be careful brushing and flossing your teeth or using a toothpick because you may get an infection or bleed more easily. If you have any dental work done, tell your dentist you are receiving this medicine. Avoid taking products that contain aspirin, acetaminophen, ibuprofen, naproxen,  or ketoprofen unless instructed by your doctor. These medicines may hide a fever. Call your doctor or health care professional if you get diarrhea or mouth sores. Do not treat yourself. To protect your kidneys, drink water or other fluids as directed while you are taking this medicine. Do not become pregnant while taking this medicine or for 6 months after stopping it. Women should inform their doctor if they wish to become pregnant or think they might be pregnant. Men should not father a child while taking this medicine and for 3 months after stopping it. This may interfere with the ability to father a child. You should talk to your doctor or health care professional if you are concerned about your fertility. There is a potential for serious side effects to an unborn child. Talk to your health care professional or pharmacist for more information. Do not breast-feed an infant while taking this medicine or for 1 week after stopping it. What side effects may I notice from receiving this medicine? Side effects that you should report to your doctor or health care professional as soon as possible: -allergic reactions like skin rash, itching or hives, swelling of the face, lips, or tongue -breathing problems -redness, blistering, peeling or loosening of the skin, including inside the mouth -signs and symptoms of bleeding such as bloody or black, tarry stools; red or dark-brown urine; spitting up blood or brown material that looks like coffee grounds; red spots on the skin; unusual bruising or bleeding from the eye, gums, or nose -signs and symptoms of infection like fever or chills; cough; sore throat; pain or trouble passing urine -signs and symptoms of kidney injury like trouble passing urine or change in the amount of urine -signs and symptoms of liver injury like dark yellow or brown urine; general ill feeling or flu-like symptoms; light-colored stools; loss of appetite; nausea; right upper belly pain;  unusually weak or tired; yellowing of the eyes or skin Side effects that usually do not require medical attention (report to your doctor or health care professional if they continue or are bothersome): -constipation -dizziness -mouth sores -nausea, vomiting -pain, tingling, numbness in the hands or feet -unusually weak or tired This list may not describe all possible side effects. Call your doctor for medical advice about side effects. You may report side effects to FDA at 1-800-FDA-1088. Where should I keep my medicine? This drug is given in a hospital or clinic and will not be stored at home. NOTE: This sheet is a summary. It may not cover all possible information. If you have questions about this medicine, talk to your doctor, pharmacist, or health care provider.  2018 Elsevier/Gold Standard (2016-07-01 18:51:46) Carboplatin injection What is this medicine? CARBOPLATIN (KAR boe pla tin) is a chemotherapy drug. It targets fast dividing cells, like cancer cells, and causes these cells to die. This medicine is used to treat ovarian cancer and many other cancers. This medicine may be used for other purposes; ask your health care provider or pharmacist if you have questions. COMMON BRAND NAME(S): Paraplatin What should I tell my health care provider before I take this medicine?  They need to know if you have any of these conditions: -blood disorders -hearing problems -kidney disease -recent or ongoing radiation therapy -an unusual or allergic reaction to carboplatin, cisplatin, other chemotherapy, other medicines, foods, dyes, or preservatives -pregnant or trying to get pregnant -breast-feeding How should I use this medicine? This drug is usually given as an infusion into a vein. It is administered in a hospital or clinic by a specially trained health care professional. Talk to your pediatrician regarding the use of this medicine in children. Special care may be needed. Overdosage: If you  think you have taken too much of this medicine contact a poison control center or emergency room at once. NOTE: This medicine is only for you. Do not share this medicine with others. What if I miss a dose? It is important not to miss a dose. Call your doctor or health care professional if you are unable to keep an appointment. What may interact with this medicine? -medicines for seizures -medicines to increase blood counts like filgrastim, pegfilgrastim, sargramostim -some antibiotics like amikacin, gentamicin, neomycin, streptomycin, tobramycin -vaccines Talk to your doctor or health care professional before taking any of these medicines: -acetaminophen -aspirin -ibuprofen -ketoprofen -naproxen This list may not describe all possible interactions. Give your health care provider a list of all the medicines, herbs, non-prescription drugs, or dietary supplements you use. Also tell them if you smoke, drink alcohol, or use illegal drugs. Some items may interact with your medicine. What should I watch for while using this medicine? Your condition will be monitored carefully while you are receiving this medicine. You will need important blood work done while you are taking this medicine. This drug may make you feel generally unwell. This is not uncommon, as chemotherapy can affect healthy cells as well as cancer cells. Report any side effects. Continue your course of treatment even though you feel ill unless your doctor tells you to stop. In some cases, you may be given additional medicines to help with side effects. Follow all directions for their use. Call your doctor or health care professional for advice if you get a fever, chills or sore throat, or other symptoms of a cold or flu. Do not treat yourself. This drug decreases your body's ability to fight infections. Try to avoid being around people who are sick. This medicine may increase your risk to bruise or bleed. Call your doctor or health care  professional if you notice any unusual bleeding. Be careful brushing and flossing your teeth or using a toothpick because you may get an infection or bleed more easily. If you have any dental work done, tell your dentist you are receiving this medicine. Avoid taking products that contain aspirin, acetaminophen, ibuprofen, naproxen, or ketoprofen unless instructed by your doctor. These medicines may hide a fever. Do not become pregnant while taking this medicine. Women should inform their doctor if they wish to become pregnant or think they might be pregnant. There is a potential for serious side effects to an unborn child. Talk to your health care professional or pharmacist for more information. Do not breast-feed an infant while taking this medicine. What side effects may I notice from receiving this medicine? Side effects that you should report to your doctor or health care professional as soon as possible: -allergic reactions like skin rash, itching or hives, swelling of the face, lips, or tongue -signs of infection - fever or chills, cough, sore throat, pain or difficulty passing urine -signs of decreased platelets or bleeding -  bruising, pinpoint red spots on the skin, black, tarry stools, nosebleeds -signs of decreased red blood cells - unusually weak or tired, fainting spells, lightheadedness -breathing problems -changes in hearing -changes in vision -chest pain -high blood pressure -low blood counts - This drug may decrease the number of white blood cells, red blood cells and platelets. You may be at increased risk for infections and bleeding. -nausea and vomiting -pain, swelling, redness or irritation at the injection site -pain, tingling, numbness in the hands or feet -problems with balance, talking, walking -trouble passing urine or change in the amount of urine Side effects that usually do not require medical attention (report to your doctor or health care professional if they  continue or are bothersome): -hair loss -loss of appetite -metallic taste in the mouth or changes in taste This list may not describe all possible side effects. Call your doctor for medical advice about side effects. You may report side effects to FDA at 1-800-FDA-1088. Where should I keep my medicine? This drug is given in a hospital or clinic and will not be stored at home. NOTE: This sheet is a summary. It may not cover all possible information. If you have questions about this medicine, talk to your doctor, pharmacist, or health care provider.  2018 Elsevier/Gold Standard (2007-12-07 14:38:05)

## 2018-04-04 DIAGNOSIS — Z7189 Other specified counseling: Secondary | ICD-10-CM | POA: Insufficient documentation

## 2018-04-04 DIAGNOSIS — C3491 Malignant neoplasm of unspecified part of right bronchus or lung: Secondary | ICD-10-CM | POA: Insufficient documentation

## 2018-04-04 MED ORDER — LIDOCAINE-PRILOCAINE 2.5-2.5 % EX CREA: TOPICAL_CREAM | CUTANEOUS | 3 refills | Status: AC

## 2018-04-04 NOTE — Progress Notes (Signed)
DISCONTINUE ON PATHWAY REGIMEN - Non-Small Cell Lung     A cycle is every 21 days:     Pembrolizumab      Pemetrexed      Carboplatin   **Always confirm dose/schedule in your pharmacy ordering system**  REASON: Other Reason PRIOR TREATMENT: [id number]: Pembrolizumab 200 mg + Pemetrexed 500 mg/m2 + Carboplatin AUC=5 q21 Days x 4-6 Cycles  START ON PATHWAY REGIMEN - Non-Small Cell Lung     A cycle is every 21 days:     Pembrolizumab      Paclitaxel      Carboplatin   **Always confirm dose/schedule in your pharmacy ordering system**  Patient Characteristics: Stage IV Metastatic, Squamous, PS = 0, 1, First Line, PD-L1 Expression Positive 1-49% (TPS) / Negative / Not Tested / Awaiting Test Results and Immunotherapy Candidate AJCC T Category: T4 Current Disease Status: Distant Metastases AJCC N Category: N3 AJCC M Category: M1c AJCC 8 Stage Grouping: IVB Histology: Squamous Cell Line of therapy: First Line PD-L1 Expression Status: Awaiting Test Results Performance Status: PS = 0, 1 Immunotherapy Candidate Status: Candidate for Immunotherapy Intent of Therapy: Non-Curative / Palliative Intent, Discussed with Patient

## 2018-04-05 ENCOUNTER — Inpatient Hospital Stay: Payer: Commercial Managed Care - PPO

## 2018-04-05 ENCOUNTER — Institutional Professional Consult (permissible substitution): Payer: Self-pay | Admitting: Radiation Oncology

## 2018-04-08 ENCOUNTER — Ambulatory Visit: Payer: Self-pay | Admitting: Oncology

## 2018-04-08 ENCOUNTER — Other Ambulatory Visit: Payer: Self-pay

## 2018-04-08 ENCOUNTER — Ambulatory Visit: Payer: Self-pay

## 2018-04-13 ENCOUNTER — Encounter: Admission: RE | Payer: Self-pay | Source: Ambulatory Visit

## 2018-04-13 ENCOUNTER — Ambulatory Visit
Admission: RE | Admit: 2018-04-13 | Payer: Commercial Managed Care - PPO | Source: Ambulatory Visit | Admitting: Vascular Surgery

## 2018-04-13 SURGERY — PORTA CATH INSERTION
Anesthesia: Moderate Sedation

## 2018-04-16 ENCOUNTER — Encounter: Payer: Self-pay | Admitting: Oncology

## 2018-05-03 ENCOUNTER — Encounter: Payer: Self-pay | Admitting: Oncology

## 2018-05-23 ENCOUNTER — Emergency Department
Admission: EM | Admit: 2018-05-23 | Discharge: 2018-05-23 | Disposition: A | Discharge status: Home / Self Care | Payer: Commercial Managed Care - PPO | Attending: Emergency Medicine | Admitting: Emergency Medicine

## 2018-05-23 ENCOUNTER — Other Ambulatory Visit: Payer: Self-pay

## 2018-05-23 ENCOUNTER — Emergency Department: Payer: Commercial Managed Care - PPO

## 2018-05-23 DIAGNOSIS — R509 Fever, unspecified: Secondary | ICD-10-CM | POA: Diagnosis present

## 2018-05-23 DIAGNOSIS — F17210 Nicotine dependence, cigarettes, uncomplicated: Secondary | ICD-10-CM | POA: Insufficient documentation

## 2018-05-23 DIAGNOSIS — Z85118 Personal history of other malignant neoplasm of bronchus and lung: Secondary | ICD-10-CM | POA: Diagnosis not present

## 2018-05-23 DIAGNOSIS — Z79899 Other long term (current) drug therapy: Secondary | ICD-10-CM | POA: Insufficient documentation

## 2018-05-23 LAB — COMPREHENSIVE METABOLIC PANEL
ALBUMIN: 2.7 g/dL — AB (ref 3.5–5.0)
ALK PHOS: 105 U/L (ref 38–126)
ALT: 23 U/L (ref 0–44)
ANION GAP: 10 (ref 5–15)
AST: 38 U/L (ref 15–41)
BUN: 19 mg/dL (ref 8–23)
CALCIUM: 8.2 mg/dL — AB (ref 8.9–10.3)
CO2: 25 mmol/L (ref 22–32)
Chloride: 95 mmol/L — ABNORMAL LOW (ref 98–111)
Creatinine, Ser: 0.77 mg/dL (ref 0.61–1.24)
GFR calc Af Amer: >60 mL/min (ref >60)
GFR calc non Af Amer: >60 mL/min (ref >60)
Glucose, Bld: 101 mg/dL — ABNORMAL HIGH (ref 70–99)
Potassium: 4.1 mmol/L (ref 3.5–5.1)
Sodium: 130 mmol/L — ABNORMAL LOW (ref 135–145)
Total Bilirubin: 0.7 mg/dL (ref 0.3–1.2)
Total Protein: 6.8 g/dL (ref 6.5–8.1)

## 2018-05-23 LAB — RESPIRATORY PANEL BY PCR
Adenovirus: NOT DETECTED
BORDETELLA PERTUSSIS-RVPCR: NOT DETECTED
CORONAVIRUS 229E-RVPPCR: NOT DETECTED
CORONAVIRUS HKU1-RVPPCR: NOT DETECTED
Chlamydophila pneumoniae: NOT DETECTED
Coronavirus NL63: NOT DETECTED
Coronavirus OC43: NOT DETECTED
INFLUENZA A-RVPPCR: NOT DETECTED
Influenza B: NOT DETECTED
METAPNEUMOVIRUS-RVPPCR: NOT DETECTED
Mycoplasma pneumoniae: NOT DETECTED
Parainfluenza Virus 1: NOT DETECTED
Parainfluenza Virus 2: NOT DETECTED
Parainfluenza Virus 3: NOT DETECTED
Parainfluenza Virus 4: NOT DETECTED
RESPIRATORY SYNCYTIAL VIRUS-RVPPCR: NOT DETECTED
RHINOVIRUS / ENTEROVIRUS - RVPPCR: NOT DETECTED

## 2018-05-23 LAB — URINALYSIS, COMPLETE (UACMP) WITH MICROSCOPIC
BILIRUBIN URINE: NEGATIVE
Bacteria, UA: NONE SEEN
GLUCOSE, UA: NEGATIVE mg/dL
Hgb urine dipstick: NEGATIVE
KETONES UR: NEGATIVE mg/dL
Leukocytes, UA: NEGATIVE
NITRITE: NEGATIVE
PH: 7 (ref 5.0–8.0)
PROTEIN: 30 mg/dL — AB
Specific Gravity, Urine: 1.013 (ref 1.005–1.030)
Squamous Epithelial / LPF: NONE SEEN (ref 0–5)

## 2018-05-23 LAB — CBC WITH DIFFERENTIAL/PLATELET
BASOS ABS: 0.1 10*3/uL (ref 0–0.1)
Basophils Relative: 1 %
EOS ABS: 0 10*3/uL (ref 0–0.7)
Eosinophils Relative: 0 %
HCT: 26.4 % — ABNORMAL LOW (ref 40.0–52.0)
HEMOGLOBIN: 9.3 g/dL — AB (ref 13.0–18.0)
Lymphocytes Relative: 7 %
Lymphs Abs: 0.7 10*3/uL — ABNORMAL LOW (ref 1.0–3.6)
MCH: 26.8 pg (ref 26.0–34.0)
MCHC: 35.4 g/dL (ref 32.0–36.0)
MCV: 75.7 fL — ABNORMAL LOW (ref 80.0–100.0)
Monocytes Absolute: 0.2 10*3/uL (ref 0.2–1.0)
Monocytes Relative: 2 %
NEUTROS PCT: 90 %
Neutro Abs: 9.2 10*3/uL — ABNORMAL HIGH (ref 1.4–6.5)
Platelets: 242 10*3/uL (ref 150–440)
RBC: 3.48 MIL/uL — AB (ref 4.40–5.90)
RDW: 20.2 % — ABNORMAL HIGH (ref 11.5–14.5)
WBC: 10 10*3/uL (ref 3.8–10.6)

## 2018-05-23 LAB — LACTIC ACID, PLASMA: LACTIC ACID, VENOUS: 1.2 mmol/L (ref 0.5–1.9)

## 2018-05-23 MED ORDER — KETOROLAC TROMETHAMINE 30 MG/ML IJ SOLN
15.0000 mg | Freq: Once | INTRAMUSCULAR | Status: AC
  Administered 2018-05-23: 15 mg via INTRAVENOUS
  Filled 2018-05-23: qty 1

## 2018-05-23 MED ORDER — SODIUM CHLORIDE 0.9 % IV BOLUS
1000.0000 mL | Freq: Once | INTRAVENOUS | Status: AC
  Administered 2018-05-23: 1000 mL via INTRAVENOUS

## 2018-05-23 MED ORDER — ONDANSETRON HCL 4 MG/2ML IJ SOLN
4.0000 mg | Freq: Once | INTRAMUSCULAR | Status: AC
  Administered 2018-05-23: 4 mg via INTRAVENOUS
  Filled 2018-05-23: qty 2

## 2018-05-23 NOTE — Discharge Instructions (Signed)
Fortunately today your lab work was reassuring.  Please take Tylenol and ibuprofen as needed for fevers and chills and follow-up with your primary care physician within 1 day for recheck.  Return to the emergency department sooner for any concerns.  It was a pleasure to take care of you today, and thank you for coming to our emergency department.  If you have any questions or concerns before leaving please ask the nurse to grab me and I'm more than happy to go through your aftercare instructions again.  If you were prescribed any opioid pain medication today such as Norco, Vicodin, Percocet, morphine, hydrocodone, or oxycodone please make sure you do not drive when you are taking this medication as it can alter your ability to drive safely.  If you have any concerns once you are home that you are not improving or are in fact getting worse before you can make it to your follow-up appointment, please do not hesitate to call [phone] and come back for further evaluation.  Darel Hong, MD  Results for orders placed or performed during the hospital encounter of 05/23/18  CBC with Differential  Result Value Ref Range   WBC 10.0 3.8 - 10.6 K/uL   RBC 3.48 (L) 4.40 - 5.90 MIL/uL   Hemoglobin 9.3 (L) 13.0 - 18.0 g/dL   HCT 26.4 (L) 40.0 - 52.0 %   MCV 75.7 (L) 80.0 - 100.0 fL   MCH 26.8 26.0 - 34.0 pg   MCHC 35.4 32.0 - 36.0 g/dL   RDW 20.2 (H) 11.5 - 14.5 %   Platelets 242 150 - 440 K/uL   Neutrophils Relative % 90 %   Neutro Abs 9.2 (H) 1.4 - 6.5 K/uL   Lymphocytes Relative 7 %   Lymphs Abs 0.7 (L) 1.0 - 3.6 K/uL   Monocytes Relative 2 %   Monocytes Absolute 0.2 0.2 - 1.0 K/uL   Eosinophils Relative 0 %   Eosinophils Absolute 0.0 0 - 0.7 K/uL   Basophils Relative 1 %   Basophils Absolute 0.1 0 - 0.1 K/uL  Comprehensive metabolic panel  Result Value Ref Range   Sodium 130 (L) 135 - 145 mmol/L   Potassium 4.1 3.5 - 5.1 mmol/L   Chloride 95 (L) 98 - 111 mmol/L   CO2 25 22 - 32 mmol/L   Glucose, Bld 101 (H) 70 - 99 mg/dL   BUN 19 8 - 23 mg/dL   Creatinine, Ser 0.77 0.61 - 1.24 mg/dL   Calcium 8.2 (L) 8.9 - 10.3 mg/dL   Total Protein 6.8 6.5 - 8.1 g/dL   Albumin 2.7 (L) 3.5 - 5.0 g/dL   AST 38 15 - 41 U/L   ALT 23 0 - 44 U/L   Alkaline Phosphatase 105 38 - 126 U/L   Total Bilirubin 0.7 0.3 - 1.2 mg/dL   GFR calc non Af Amer >60 >60 mL/min   GFR calc Af Amer >60 >60 mL/min   Anion gap 10 5 - 15  Lactic acid, plasma  Result Value Ref Range   Lactic Acid, Venous 1.2 0.5 - 1.9 mmol/L  Urinalysis, Complete w Microscopic  Result Value Ref Range   Color, Urine YELLOW (A) YELLOW   APPearance CLEAR (A) CLEAR   Specific Gravity, Urine 1.013 1.005 - 1.030   pH 7.0 5.0 - 8.0   Glucose, UA NEGATIVE NEGATIVE mg/dL   Hgb urine dipstick NEGATIVE NEGATIVE   Bilirubin Urine NEGATIVE NEGATIVE   Ketones, ur NEGATIVE NEGATIVE mg/dL  Protein, ur 30 (A) NEGATIVE mg/dL   Nitrite NEGATIVE NEGATIVE   Leukocytes, UA NEGATIVE NEGATIVE   RBC / HPF 0-5 0 - 5 RBC/hpf   WBC, UA 0-5 0 - 5 WBC/hpf   Bacteria, UA NONE SEEN NONE SEEN   Squamous Epithelial / LPF NONE SEEN 0 - 5   Mucus PRESENT    Dg Chest 2 View  Result Date: 05/23/2018 CLINICAL DATA:  Lung cancer patient with fever at home. Smoker. Chemotherapy. EXAM: CHEST - 2 VIEW COMPARISON:  03/25/2018 FINDINGS: 7.7 cm diameter mass lesion in the right lower lung similar to prior study and corresponding to known cancer. Left lung is clear and expanded. Heart size and pulmonary vascularity are normal. No blunting of costophrenic angles. No pneumothorax. Mediastinal contours appear intact. IMPRESSION: 7.7 cm mass lesion in the right lower lung similar to prior study. No acute changes. Electronically Signed   By: Lucienne Capers M.D.   On: 05/23/2018 02:33

## 2018-05-23 NOTE — ED Provider Notes (Signed)
[facility] Emergency Department Provider Note  ____________________________________________   First MD Initiated Contact with Patient 05/23/18 0139     (approximate)  I have reviewed the triage vital signs and the nursing notes.   HISTORY  Chief Complaint Fever   HPI Micheal Decker is a 62 y.o. male who self presents to the emergency department with fever to 100.9 degrees at home.  He is concerned because he is currently receiving chemotherapy for stage IV lung cancer.  His last chemotherapy was 3 days ago.  He has had a generalized headache and malaise.  No rhinorrhea.  No cough.  No dysuria.  No abdominal pain nausea or vomiting.  Symptoms came on suddenly are moderate severity improved with antipyretics.  Nothing seems to make them better or worse.  No ear pain.  No rash.  She came to the emergency department because he was told by his oncologist to always be checked out when he had a fever with chemotherapy.  No sick contacts.  Family attempted to call his oncologist at [facility] however they did not call back so they came to our ER tonight.    No past medical history on file.  Patient Active Problem List   Diagnosis Date Noted  . Goals of care, counseling/discussion 04/04/2018  . 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 Right lower lobe lung mass 03/14/2018    Past Surgical History:  Procedure Laterality Date  . KNEE SURGERY      Prior to Admission medications   Medication Sig Start Date End Date Taking? Authorizing Provider  benzonatate (TESSALON) 100 MG capsule Take 1 capsule (100 mg total) by mouth 3 (three) times daily as needed for cough. 04/02/18   Lloyd Huger, MD  cetirizine (ZYRTEC) 10 MG tablet Take 1 tablet by mouth daily. 03/01/18   [provider]  dexamethasone (DECADRON) 4 MG tablet Take 1 tablet (4 mg total) by mouth 2 (two) times daily with a meal. 04/02/18   Jacquelin Hawking, NP  lidocaine-prilocaine  (EMLA) cream Apply to affected area once 04/04/18   Lloyd Huger, MD  meloxicam (MOBIC) 15 MG tablet Take 15 mg by mouth daily. 02/01/18   [provider]  montelukast (SINGULAIR) 10 MG tablet Take 1 tablet by mouth every evening. 03/02/18 03/02/19  [provider]  omeprazole (PRILOSEC) 20 MG capsule Take 2 capsules (40 mg total) by mouth daily. 03/22/18 03/22/19  Lloyd Huger, MD  ondansetron (ZOFRAN) 8 MG tablet Take 1 tablet (8 mg total) by mouth 3 (three) times daily as needed for nausea or vomiting. 03/26/18   Lloyd Huger, MD  prochlorperazine (COMPAZINE) 25 MG suppository Place 1 suppository (25 mg total) rectally every 12 (twelve) hours as needed for nausea or vomiting. 03/26/18   Lloyd Huger, MD  tadalafil (CIALIS) 10 MG tablet Take 10 mg by mouth daily as needed for erectile dysfunction.    [provider]    Allergies Patient has no known allergies.  Family History  Problem Relation Age of Onset  . Stroke Mother   . Heart attack Father   . Diabetes Sister   . Heart attack Sister   . Cancer Sister     Social History Social History   Tobacco Use  . Smoking status: Current Every Day Smoker    Packs/day: 1.50    Types: Cigarettes  . Smokeless tobacco: Never Used  Substance Use Topics  . Alcohol use: Not Currently  Comment: quit drinking 2018  . Drug use: Never    Review of Systems Constitutional: Positive for fevers and chills Eyes: No visual changes. ENT: No sore throat. Cardiovascular: Denies chest pain. Respiratory: Denies shortness of breath. Gastrointestinal: No abdominal pain.  No nausea, no vomiting.  No diarrhea.  No constipation. Genitourinary: Negative for dysuria. Musculoskeletal: Negative for back pain. Skin: Negative for rash. Neurological:Positive for headache   ____________________________________________   PHYSICAL EXAM:  VITAL SIGNS: ED Triage Vitals  Enc Vitals Group     BP 05/23/18 0038  (!) 106/55     Pulse Rate 05/23/18 0038 99     Resp 05/23/18 0038 20     Temp 05/23/18 0038 99.9 F (37.7 C)     Temp Source 05/23/18 0038 Oral     SpO2 05/23/18 0038 97 %     Weight 05/23/18 0039 180 lb (81.6 kg)     Height 05/23/18 0039 5\' 9"  (1.753 m)     Head Circumference --      Peak Flow --      Pain Score --      Pain Loc --      Pain Edu? --      Excl. in [place]? --     Constitutional: Alert and oriented x4 appears somewhat uncomfortable holding himself in several blankets  Eyes: PERRL EOMI. Head: Atraumatic.  Normal tympanic membranes bilaterally Nose: No congestion/rhinnorhea. Mouth/Throat: No trismus uvula midline no pharyngeal erythema or exudate Neck: No stridor.   Cardiovascular: Normal rate, regular rhythm. Grossly normal heart sounds.  Good peripheral circulation. Respiratory: Normal respiratory effort.  No retractions.  Decreased breath sounds on the right of the lungs largely clear Gastrointestinal: Soft nontender Musculoskeletal: No lower extremity edema   Neurologic:  Normal speech and language. No gross focal neurologic deficits are appreciated. Skin:  Skin is warm, dry and intact. No rash noted. Psychiatric: Mood and affect are normal. Speech and behavior are normal.    ____________________________________________   DIFFERENTIAL includes but not limited to  Neutropenic fever, bacteremia, upper respiratory tract infection, sepsis ____________________________________________   LABS (all labs ordered are listed, but only abnormal results are displayed)  Labs Reviewed  CBC WITH DIFFERENTIAL/PLATELET - Abnormal; Notable for the following components:      Result Value   RBC 3.48 (*)    Hemoglobin 9.3 (*)    HCT 26.4 (*)    MCV 75.7 (*)    RDW 20.2 (*)    Neutro Abs 9.2 (*)    Lymphs Abs 0.7 (*)    All other components within normal limits  COMPREHENSIVE METABOLIC PANEL - Abnormal; Notable for the following components:   Sodium 130 (*)    Chloride  95 (*)    Glucose, Bld 101 (*)    Calcium 8.2 (*)    Albumin 2.7 (*)    All other components within normal limits  URINALYSIS, COMPLETE (UACMP) WITH MICROSCOPIC - Abnormal; Notable for the following components:   Color, Urine YELLOW (*)    APPearance CLEAR (*)    Protein, ur 30 (*)    All other components within normal limits  CULTURE, BLOOD (ROUTINE X 2)  CULTURE, BLOOD (ROUTINE X 2)  RESPIRATORY PANEL BY PCR  LACTIC ACID, PLASMA    Lab work reviewed by me shows the patient is not neutropenic.  Lab work suggestive of slight dehydration __________________________________________  EKG   ____________________________________________  RADIOLOGY  Chest x-ray reviewed by me with no acute disease ____________________________________________   PROCEDURES  Procedure(s) performed: no  Procedures  Critical Care performed: no  ____________________________________________   INITIAL IMPRESSION / ASSESSMENT AND PLAN / ED COURSE  Pertinent labs & imaging results that were available during my care of the patient were reviewed by me and considered in my medical decision making (see chart for details).   As part of my medical decision making, I reviewed the following data within the [place] History obtained from family if available, nursing notes, old chart and ekg, as well as notes from prior ED visits.  Patient arrives with low-grade fever several days after having his second lifetime chemotherapy.  This is not the right time course for neutropenic fever so I have not put him under precautions however labs including culture and lactic acid are pending.  We will also send a nasal swab.     ----------------------------------------- 3:08 AM on 05/23/2018 ----------------------------------------- The patient feels improved and is defervesced.  No clear source of his fever identified.  He did not have induction chemotherapy and do not suspect tumor lysis syndrome.   We attempted to contact his oncologist at [facility] multiple times however did not get a call back.  Regardless at this point as the patient feels well and is not neutropenic I will treat him as a regular person who likely has viral syndrome.  He is discharged home in improved condition with strict return precautions given.  ____________________________________________   FINAL CLINICAL IMPRESSION(S) / ED DIAGNOSES  Final diagnoses:  Febrile illness      NEW MEDICATIONS STARTED DURING THIS VISIT:  Discharge Medication List as of 05/23/2018  3:11 AM       Note:  This document was prepared using Dragon voice recognition software and may include unintentional dictation errors.     Darel Hong, MD 05/24/18 [phone]

## 2018-05-23 NOTE — ED Triage Notes (Addendum)
Patient with fever at home (100.9).  Patient receiving chemo.

## 2018-05-28 LAB — CULTURE, BLOOD (ROUTINE X 2)
Culture: NO GROWTH
Culture: NO GROWTH

## 2018-08-28 ENCOUNTER — Emergency Department
Admission: EM | Admit: 2018-08-28 | Discharge: 2018-08-28 | Disposition: A | Discharge status: Home / Self Care | Payer: Commercial Managed Care - PPO | Attending: Emergency Medicine | Admitting: Emergency Medicine

## 2018-08-28 ENCOUNTER — Other Ambulatory Visit: Payer: Self-pay

## 2018-08-28 DIAGNOSIS — Z79899 Other long term (current) drug therapy: Secondary | ICD-10-CM | POA: Diagnosis not present

## 2018-08-28 DIAGNOSIS — C7931 Secondary malignant neoplasm of brain: Secondary | ICD-10-CM | POA: Diagnosis not present

## 2018-08-28 DIAGNOSIS — C7951 Secondary malignant neoplasm of bone: Secondary | ICD-10-CM | POA: Diagnosis not present

## 2018-08-28 DIAGNOSIS — C3490 Malignant neoplasm of unspecified part of unspecified bronchus or lung: Secondary | ICD-10-CM | POA: Diagnosis not present

## 2018-08-28 DIAGNOSIS — C772 Secondary and unspecified malignant neoplasm of intra-abdominal lymph nodes: Secondary | ICD-10-CM | POA: Insufficient documentation

## 2018-08-28 DIAGNOSIS — R1012 Left upper quadrant pain: Secondary | ICD-10-CM | POA: Insufficient documentation

## 2018-08-28 DIAGNOSIS — F17210 Nicotine dependence, cigarettes, uncomplicated: Secondary | ICD-10-CM | POA: Insufficient documentation

## 2018-08-28 NOTE — ED Provider Notes (Signed)
[facility] Emergency Department Provider Note  ____________________________________________   First MD Initiated Contact with Patient 08/28/18 0131     (approximate)  I have reviewed the triage vital signs and the nursing notes.   HISTORY  Chief Complaint Abdominal Pain    HPI Micheal Decker is a 62 y.o. 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  He was evaluated yesterday at [facility] with an MRI to determine if he now has metastases to the thoracic spine.  He presents tonight private vehicle for evaluation of acute onset left-sided abdominal pain that was sharp, stabbing, and severe, but which is completely resolved.  He took his oral oxycodone as per his usual pain regimen.  He thinks it may have been gas.  Nothing in particular made the symptoms better or worse.  He denies fever/chills, nausea, vomiting, shortness of breath, and dysuria.  No past medical history on file.  Patient Active Problem List   Diagnosis Date Noted  . Goals of care, counseling/discussion 04/04/2018  . 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 Right lower lobe lung mass 03/14/2018    Past Surgical History:  Procedure Laterality Date  . KNEE SURGERY      Prior to Admission medications   Medication Sig Start Date End Date Taking? Authorizing Provider  OLANZapine (ZYPREXA) 2.5 MG tablet Take 2.5 mg by mouth daily.   Yes [provider]  OLANZapine (ZYPREXA) 5 MG tablet Take 5 mg by mouth at bedtime.   Yes [provider]  oxyCODONE (OXY IR/ROXICODONE) 5 MG immediate release tablet Take 5 mg by mouth every 4 (four) hours as needed for severe pain.   Yes [provider]  benzonatate (TESSALON) 100 MG capsule Take 1 capsule (100 mg total) by mouth 3 (three) times daily as needed for cough. 04/02/18   Lloyd Huger, MD  cetirizine (ZYRTEC) 10 MG tablet Take 1 tablet by mouth  daily. 03/01/18   [provider]  dexamethasone (DECADRON) 4 MG tablet Take 1 tablet (4 mg total) by mouth 2 (two) times daily with a meal. 04/02/18   Jacquelin Hawking, NP  lidocaine-prilocaine (EMLA) cream Apply to affected area once 04/04/18   Lloyd Huger, MD  meloxicam (MOBIC) 15 MG tablet Take 15 mg by mouth daily. 02/01/18   [provider]  montelukast (SINGULAIR) 10 MG tablet Take 1 tablet by mouth every evening. 03/02/18 03/02/19  [provider]  omeprazole (PRILOSEC) 20 MG capsule Take 2 capsules (40 mg total) by mouth daily. 03/22/18 03/22/19  Lloyd Huger, MD  ondansetron (ZOFRAN) 8 MG tablet Take 1 tablet (8 mg total) by mouth 3 (three) times daily as needed for nausea or vomiting. 03/26/18   Lloyd Huger, MD  prochlorperazine (COMPAZINE) 25 MG suppository Place 1 suppository (25 mg total) rectally every 12 (twelve) hours as needed for nausea or vomiting. 03/26/18   Lloyd Huger, MD  tadalafil (CIALIS) 10 MG tablet Take 10 mg by mouth daily as needed for erectile dysfunction.    [provider]    Allergies Patient has no known allergies.  Family History  Problem Relation Age of Onset  . Stroke Mother   . Heart attack Father   . Diabetes Sister   . Heart attack Sister   . Cancer Sister     Social History Social History   Tobacco Use  . Smoking status: Current Every Day  Smoker    Packs/day: 1.50    Types: Cigarettes  . Smokeless tobacco: Never Used  Substance Use Topics  . Alcohol use: Not Currently    Comment: quit drinking 2018  . Drug use: Never    Review of Systems Constitutional: No fever/chills Eyes: No visual changes. ENT: No sore throat. Cardiovascular: Denies chest pain. Respiratory: Denies shortness of breath. Gastrointestinal: Left-sided abdominal pain as described above.  No nausea nor vomiting. Genitourinary: Negative for dysuria. Musculoskeletal: Chronic pain associated with metastatic lung  cancer. Integumentary: Negative for rash. Neurological: Negative for headaches, focal weakness or numbness.   ____________________________________________   PHYSICAL EXAM:  VITAL SIGNS: ED Triage Vitals  Enc Vitals Group     BP --      Pulse --      Resp --      Temp --      Temp src --      SpO2 --      Weight 08/28/18 0114 77.1 kg (170 lb)     Height 08/28/18 0114 1.753 m (5\' 9" )     Head Circumference --      Peak Flow --      Pain Score 08/28/18 0113 7     Pain Loc --      Pain Edu? --      Excl. in [place]? --     Constitutional: Alert and oriented.  Appears chronically ill but is not in acute distress. Eyes: Conjunctivae are normal.  Head: Atraumatic. Nose: No congestion/rhinnorhea. Mouth/Throat: Mucous membranes are moist. Neck: No stridor.  No meningeal signs.   Cardiovascular: Normal rate, regular rhythm. Good peripheral circulation. Grossly normal heart sounds. Respiratory: Normal respiratory effort.  No retractions. Lungs CTAB. Gastrointestinal: Soft and nontender. No distention.  Musculoskeletal: No lower extremity tenderness nor edema. No gross deformities of extremities. Neurologic:  Normal speech and language. No gross focal neurologic deficits are appreciated.  Skin:  Skin is warm, dry and intact. No rash noted. Psychiatric: Mood and affect are normal. Speech and behavior are normal.  ____________________________________________   LABS (all labs ordered are listed, but only abnormal results are displayed)  Labs Reviewed - No data to display ____________________________________________  EKG  ED ECG REPORT I, Hinda Kehr, the attending physician, personally viewed and interpreted this ECG.  Date: 08/28/2018 EKG Time: 1:24 AM Rate: 92 Rhythm: normal sinus rhythm QRS Axis: Left axis deviation Intervals: normal ST/T Wave abnormalities: Non-specific ST segment / T-wave changes, but no evidence of acute ischemia. Narrative Interpretation: no  evidence of acute ischemia   ____________________________________________  RADIOLOGY   ED MD interpretation: No indication for imaging  Official radiology report(s): No results found.  ____________________________________________   PROCEDURES  Critical Care performed: No   Procedure(s) performed:   Procedures   ____________________________________________   INITIAL IMPRESSION / ASSESSMENT AND PLAN / ED COURSE  As part of my medical decision making, I reviewed the following data within the [place] notes reviewed and incorporated, Labs reviewed , EKG interpreted , Old chart reviewed and Notes from prior ED visits    Differential diagnosis includes, but is not limited to, gas pains, SBO/ileus, worsening metastases, splenic infarction, etc.  However the patient's pain is almost completely resolved.  He is in no distress at this time wants to go home.  He declined additional testing and imaging and I think that is appropriate.  He will follow-up as an outpatient.  I gave my usual customary return precautions.  ____________________________________________  FINAL CLINICAL IMPRESSION(S) / ED DIAGNOSES  Final diagnoses:  Left upper quadrant pain     MEDICATIONS GIVEN DURING THIS VISIT:  Medications - No data to display   ED Discharge Orders    None       Note:  This document was prepared using Dragon voice recognition software and may include unintentional dictation errors.    Hinda Kehr, MD 08/28/18 [phone]

## 2018-08-28 NOTE — Discharge Instructions (Signed)
As we discussed, by the time I saw you your pain had almost completely resolved.  I offered to do lab work and obtain imaging but you are comfortable going home for outpatient follow-up and I agree that that is appropriate.  Please follow-up with your regular doctors at the next available opportunity and return to the emergency department if needed.

## 2018-08-28 NOTE — ED Notes (Signed)
PT AND SPOUSE AT THIS TIME STATE THAT THEY DO NOT WANT ANY MORE TESTING TODAY , THE PAIN IS ALL BUT GONE AWAY AND THEY FEEL IT IS MANAGEABLE . PROVIDER MADE AWARE . AND GOING TO BEDSIDE TO SPEAK TO PT AND FAMILY .

## 2018-08-28 NOTE — ED Triage Notes (Signed)
Pt in with co left sided abd pain, pt has hx of lung ca with mets to brain and hip. Pt did have MRI x 2 at [facility] reg yesterday to back, they are ruling out mets to spine.

## 2018-10-16 DEATH — deceased

## 2020-06-28 IMAGING — DX DG CHEST 1V PORT
1 series · 1 of 1 positions shown · non-contrast
Comparison: Imaging during CT-guided biopsy earlier today.

CLINICAL DATA: Right pneumothorax after biopsy of right lower lobe
lung mass.

EXAM:
PORTABLE CHEST 1 VIEW

[chest ap]
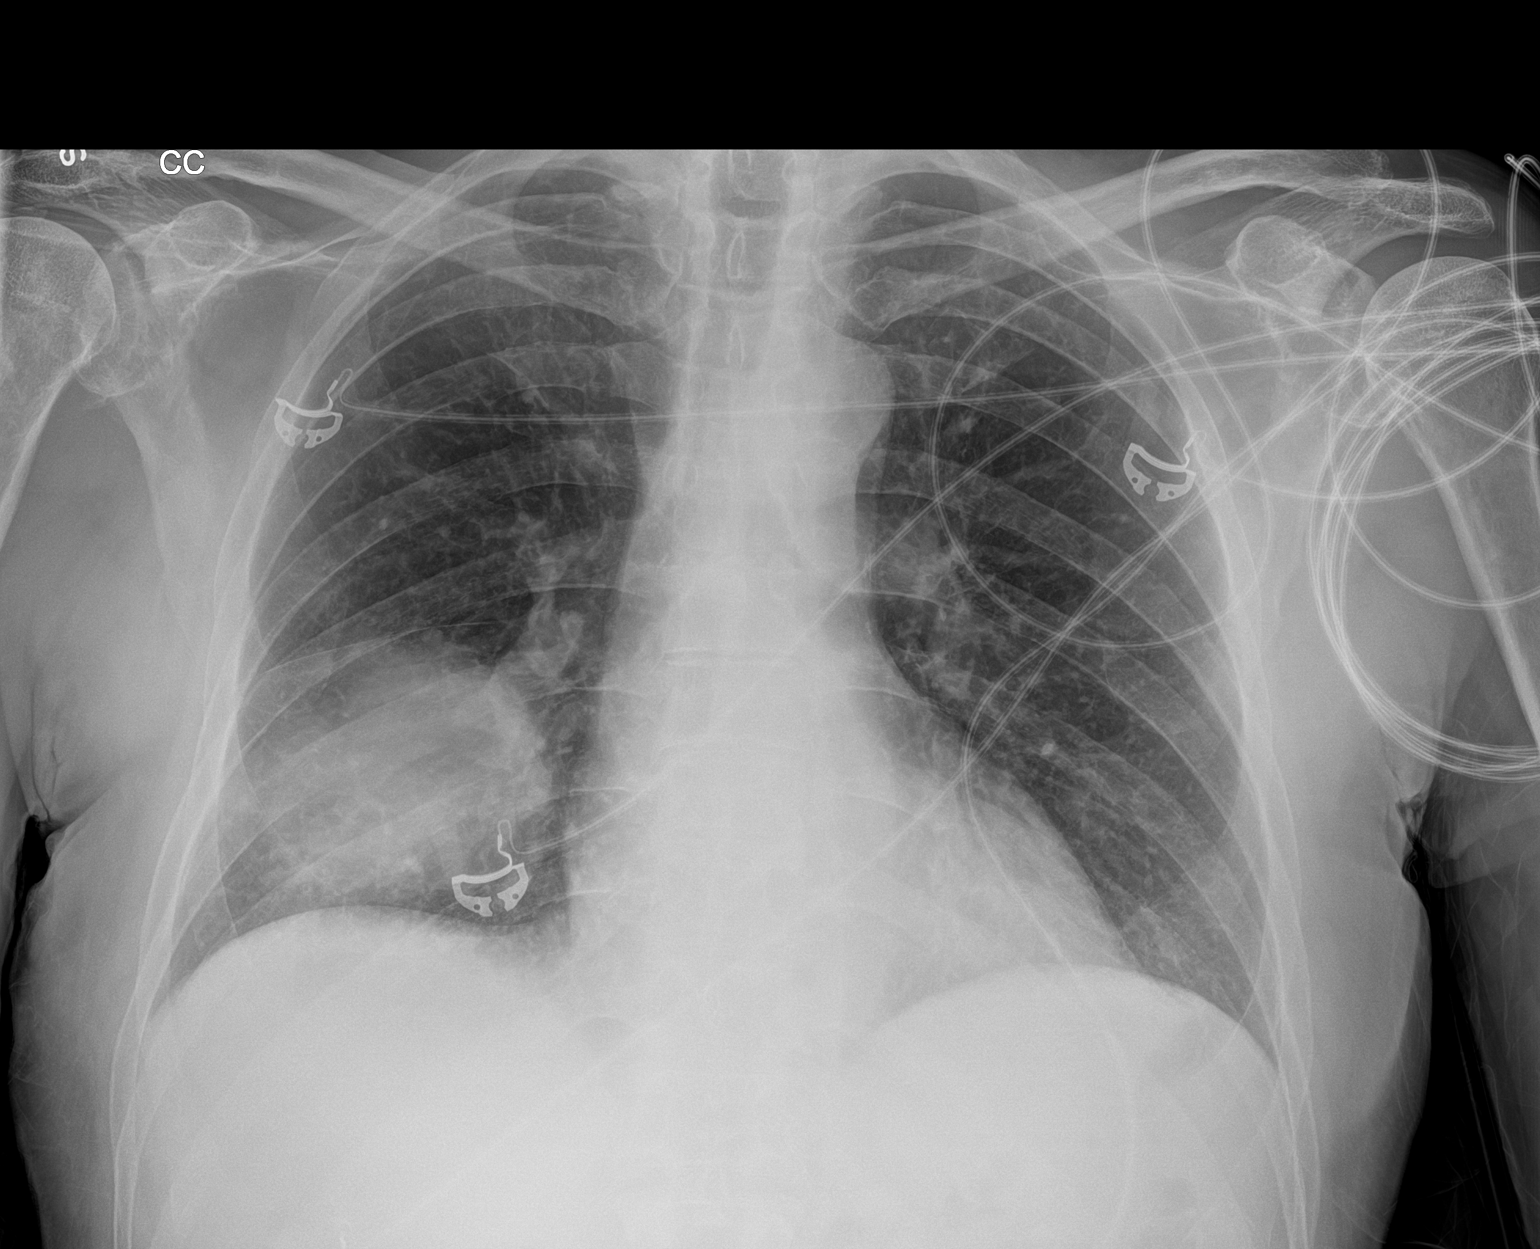

[1 of 1 positions shown; findings below may reference images not displayed]

FINDINGS: Follow-up chest x-ray shows no evidence of pneumothorax. Large right
lower lobe lung mass again noted. There is no evidence of pulmonary
edema, consolidation or pleural fluid. The heart size and
mediastinal contours are within normal limits.
IMPRESSION: No pneumothorax or other acute findings following right lung biopsy.
